# Patient Record
Sex: Male | Born: 1960 | Race: White | Hispanic: No | Marital: Married | Smoking: Never smoker
Health system: Southern US, Community
[De-identification: ages and names within clinical notes are randomized; demographics above are authoritative.]

## PROBLEM LIST (undated history)

## (undated) DIAGNOSIS — R011 Cardiac murmur, unspecified: Secondary | ICD-10-CM

## (undated) DIAGNOSIS — E785 Hyperlipidemia, unspecified: Secondary | ICD-10-CM

## (undated) DIAGNOSIS — N529 Male erectile dysfunction, unspecified: Secondary | ICD-10-CM

## (undated) HISTORY — DX: Male erectile dysfunction, unspecified: N52.9

## (undated) HISTORY — PX: NASAL SINUS SURGERY: SHX719

## (undated) HISTORY — DX: Hyperlipidemia, unspecified: E78.5

## (undated) HISTORY — DX: Cardiac murmur, unspecified: R01.1

---

## 2010-11-13 ENCOUNTER — Ambulatory Visit: Payer: Self-pay | Admitting: Unknown Physician Specialty

## 2011-01-26 ENCOUNTER — Ambulatory Visit: Payer: Self-pay | Admitting: Anesthesiology

## 2012-04-10 ENCOUNTER — Emergency Department: Payer: Self-pay | Admitting: Internal Medicine

## 2012-04-10 LAB — CBC
HCT: 40.5 % (ref 40.0–52.0)
HGB: 14.1 g/dL (ref 13.0–18.0)
MCHC: 34.8 g/dL (ref 32.0–36.0)
MCV: 88 fL (ref 80–100)
Platelet: 121 10*3/uL — ABNORMAL LOW (ref 150–440)
RBC: 4.6 10*6/uL (ref 4.40–5.90)
RDW: 13.1 % (ref 11.5–14.5)
WBC: 5.8 10*3/uL (ref 3.8–10.6)

## 2012-04-10 LAB — CK TOTAL AND CKMB (NOT AT ARMC): CK, Total: 51 U/L (ref 35–232)

## 2012-04-10 LAB — BASIC METABOLIC PANEL
BUN: 16 mg/dL (ref 7–18)
Creatinine: 0.96 mg/dL (ref 0.60–1.30)
Glucose: 89 mg/dL (ref 65–99)
Potassium: 3.5 mmol/L (ref 3.5–5.1)
Sodium: 140 mmol/L (ref 136–145)

## 2014-07-27 ENCOUNTER — Ambulatory Visit: Payer: Self-pay | Admitting: Family Medicine

## 2014-12-10 ENCOUNTER — Ambulatory Visit: Payer: Self-pay | Admitting: Gastroenterology

## 2016-09-08 ENCOUNTER — Encounter (INDEPENDENT_AMBULATORY_CARE_PROVIDER_SITE_OTHER): Payer: Self-pay

## 2016-09-08 ENCOUNTER — Encounter: Payer: Self-pay | Admitting: Family Medicine

## 2016-09-08 ENCOUNTER — Ambulatory Visit (INDEPENDENT_AMBULATORY_CARE_PROVIDER_SITE_OTHER): Payer: BLUE CROSS/BLUE SHIELD | Admitting: Family Medicine

## 2016-09-08 VITALS — BP 120/74 | HR 63 | Temp 98.2°F | Ht 71.0 in | Wt 186.6 lb

## 2016-09-08 DIAGNOSIS — N529 Male erectile dysfunction, unspecified: Secondary | ICD-10-CM | POA: Diagnosis not present

## 2016-09-08 DIAGNOSIS — R413 Other amnesia: Secondary | ICD-10-CM | POA: Diagnosis not present

## 2016-09-08 DIAGNOSIS — Z1159 Encounter for screening for other viral diseases: Secondary | ICD-10-CM | POA: Diagnosis not present

## 2016-09-08 DIAGNOSIS — R1011 Right upper quadrant pain: Secondary | ICD-10-CM | POA: Diagnosis not present

## 2016-09-08 LAB — COMPREHENSIVE METABOLIC PANEL
ALK PHOS: 53 U/L (ref 39–117)
ALT: 19 U/L (ref 0–53)
AST: 27 U/L (ref 0–37)
Albumin: 4.3 g/dL (ref 3.5–5.2)
BUN: 17 mg/dL (ref 6–23)
CHLORIDE: 103 meq/L (ref 96–112)
CO2: 32 mEq/L (ref 19–32)
Calcium: 9.3 mg/dL (ref 8.4–10.5)
Creatinine, Ser: 0.98 mg/dL (ref 0.40–1.50)
GFR: 84.38 mL/min (ref >60.00)
GLUCOSE: 100 mg/dL — AB (ref 70–99)
Potassium: 4.4 mEq/L (ref 3.5–5.1)
SODIUM: 140 meq/L (ref 135–145)
TOTAL PROTEIN: 7.1 g/dL (ref 6.0–8.3)
Total Bilirubin: 0.3 mg/dL (ref 0.2–1.2)

## 2016-09-08 NOTE — Progress Notes (Signed)
Pre visit review using our clinic review tool, if applicable. No additional management support is needed unless otherwise documented below in the visit note. 

## 2016-09-08 NOTE — Assessment & Plan Note (Signed)
No recurrence of pain in the last month. Vague discomfort. Benign exam today. Discussed checking lab work today and patient was only interested in checking liver function and hepatitis C. These will be checked and we will contact patient with the results.

## 2016-09-08 NOTE — Assessment & Plan Note (Signed)
Patient with mild memory difficulty regarding names and certain details. No ADL and IADL issues. Discussed continuing to monitor and if progresses working up further.

## 2016-09-08 NOTE — Patient Instructions (Signed)
Nice to meet you. We'll check some lab work regarding your liver. Please monitor your memory and if there are changes or worsening please let us know. We will refer you to urology. If you do not hear about this in the next 1-2 weeks please let us know.

## 2016-09-08 NOTE — Assessment & Plan Note (Signed)
Patient interested in medication for this. Has had difficulty affording this in the past. We'll refer to [facility] in [place] to see if they have any assistance in providing Viagra.

## 2016-09-08 NOTE — Progress Notes (Signed)
Tommi Rumps, MD Phone: [phone]  GABRIELLA GUILE is a 55 y.o. male who presents today for new patient visit.  Abdominal pain: Patient notes about a month ago he had a right mid and upper quadrant abdominal discomfort. Notes he has had this in the past and was advised that his belt was too tight. Notes it was a dull ache. Maybe felt like there was a swelling in the area. No other symptoms with this. Has not recurred since then. Resolved on its own.  Patient notes issues with memory specifically regarding names and forgetting some details. No issues with ADLs or IADLs. Has been slowly progressive.  Erectile dysfunction: Patient notes he was previously on Viagra though he is unable to afford this now. He is able to ejaculate. Notes difficulty getting and obtaining an erection. No prior urology evaluation.  Active Ambulatory Problems    Diagnosis Date Noted  . RUQ abdominal pain 09/08/2016  . Erectile dysfunction 09/08/2016  . Memory difficulty 09/08/2016   Resolved Ambulatory Problems    Diagnosis Date Noted  . No Resolved Ambulatory Problems   Past Medical History:  Diagnosis Date  . Heart murmur     Family History  Problem Relation Age of Onset  . Heart disease Father   . Alcoholism Brother   . Diabetes Maternal Grandmother   . Diabetes Maternal Grandfather     Social History   Social History  . Marital status: Married    Spouse name: N/A  . Number of children: N/A  . Years of education: N/A   Occupational History  . Not on file.   Social History Main Topics  . Smoking status: Never Smoker  . Smokeless tobacco: Never Used  . Alcohol use 3.6 oz/week    6 Glasses of wine per week  . Drug use: No  . Sexual activity: Not on file   Other Topics Concern  . Not on file   Social History Narrative  . No narrative on file    ROS  General:  Negative for nexplained weight loss, fever Skin: Negative for new or changing mole, sore that won't heal HEENT:  Negative for trouble hearing, trouble seeing, ringing in ears, mouth sores, hoarseness, change in voice, dysphagia. CV:  Negative for chest pain, dyspnea, edema, palpitations Resp: Negative for cough, dyspnea, hemoptysis GI: Positive for abdominal pain, Negative for nausea, vomiting, diarrhea, constipation, melena, hematochezia. GU: Positive for sexual difficulty, Negative for dysuria, incontinence, urinary hesitance, hematuria, vaginal or penile discharge, polyuria, lumps in testicle or breasts MSK: Negative for muscle cramps or aches, joint pain or swelling Neuro: Negative for headaches, weakness, numbness, dizziness, passing out/fainting Psych: Negative for depression, anxiety, positive for memory problems  Objective  Physical Exam Vitals:   09/08/16 0904  BP: 120/74  Pulse: 63  Temp: 98.2 F (36.8 C)    BP Readings from Last 3 Encounters:  09/08/16 120/74   Wt Readings from Last 3 Encounters:  09/08/16 186 lb 9.6 oz (84.6 kg)    Physical Exam  Constitutional: No distress.  HENT:  Head: Normocephalic and atraumatic.  Mouth/Throat: Oropharynx is clear and moist. No oropharyngeal exudate.  Eyes: Conjunctivae are normal. Pupils are equal, round, and reactive to light.  Cardiovascular: Normal rate, regular rhythm and normal heart sounds.   Pulmonary/Chest: Effort normal and breath sounds normal.  Abdominal: Soft. Bowel sounds are normal. He exhibits no distension and no mass. There is no tenderness. There is no rebound and no guarding.  Musculoskeletal: He exhibits no  edema.  Neurological: He is alert. Gait normal.  Skin: Skin is warm and dry. He is not diaphoretic.  Psychiatric: Mood and affect normal.     Assessment/Plan:   Erectile dysfunction Patient interested in medication for this. Has had difficulty affording this in the past. We'll refer to [facility] in [place] to see if they have any assistance in providing Viagra.  RUQ abdominal pain No recurrence  of pain in the last month. Vague discomfort. Benign exam today. Discussed checking lab work today and patient was only interested in checking liver function and hepatitis C. These will be checked and we will contact patient with the results.  Memory difficulty Patient with mild memory difficulty regarding names and certain details. No ADL and IADL issues. Discussed continuing to monitor and if progresses working up further.   Orders Placed This Encounter  Procedures  . Hepatitis C Antibody  . Comp Met (CMET)  . Ambulatory referral to Urology    Referral Priority:   Routine    Referral Type:   Consultation    Referral Reason:   Specialty Services Required    Requested Specialty:   Urology    Number of Visits Requested:   [place], MD [place]

## 2016-09-09 LAB — HEPATITIS C ANTIBODY: HCV AB: NEGATIVE

## 2016-09-14 ENCOUNTER — Telehealth: Payer: Self-pay | Admitting: Family Medicine

## 2016-09-14 NOTE — Telephone Encounter (Signed)
Left a VM to return my call. 

## 2016-09-14 NOTE — Telephone Encounter (Signed)
Pt called returning your call regarding lab results. Thank you!  Call pt @ [phone].

## 2016-09-14 NOTE — Telephone Encounter (Signed)
Pt is returning office phone call. He believes it is for test results.

## 2016-09-14 NOTE — Telephone Encounter (Signed)
Notified patient of lab results 

## 2017-02-24 ENCOUNTER — Ambulatory Visit: Payer: BLUE CROSS/BLUE SHIELD | Admitting: Family Medicine

## 2017-03-01 ENCOUNTER — Ambulatory Visit
Admission: RE | Admit: 2017-03-01 | Discharge: 2017-03-01 | Disposition: A | Discharge status: Home / Self Care | Payer: BLUE CROSS/BLUE SHIELD | Source: Ambulatory Visit | Attending: Family Medicine | Admitting: Family Medicine

## 2017-03-01 ENCOUNTER — Ambulatory Visit (INDEPENDENT_AMBULATORY_CARE_PROVIDER_SITE_OTHER): Payer: BLUE CROSS/BLUE SHIELD | Admitting: Family Medicine

## 2017-03-01 ENCOUNTER — Encounter: Payer: Self-pay | Admitting: Family Medicine

## 2017-03-01 VITALS — BP 122/82 | HR 61 | Temp 98.4°F | Wt 194.2 lb

## 2017-03-01 DIAGNOSIS — K760 Fatty (change of) liver, not elsewhere classified: Secondary | ICD-10-CM | POA: Diagnosis not present

## 2017-03-01 DIAGNOSIS — K5790 Diverticulosis of intestine, part unspecified, without perforation or abscess without bleeding: Secondary | ICD-10-CM | POA: Insufficient documentation

## 2017-03-01 DIAGNOSIS — R911 Solitary pulmonary nodule: Secondary | ICD-10-CM | POA: Insufficient documentation

## 2017-03-01 DIAGNOSIS — R1031 Right lower quadrant pain: Secondary | ICD-10-CM

## 2017-03-01 DIAGNOSIS — I700 Atherosclerosis of aorta: Secondary | ICD-10-CM | POA: Diagnosis not present

## 2017-03-01 LAB — CBC
HCT: 46.4 % (ref 39.0–52.0)
Hemoglobin: 15.9 g/dL (ref 13.0–17.0)
MCHC: 34.3 g/dL (ref 30.0–36.0)
MCV: 90.9 fl (ref 78.0–100.0)
PLATELETS: 218 10*3/uL (ref 150.0–400.0)
RBC: 5.11 Mil/uL (ref 4.22–5.81)
RDW: 13.2 % (ref 11.5–15.5)
WBC: 4.8 10*3/uL (ref 4.0–10.5)

## 2017-03-01 LAB — COMPREHENSIVE METABOLIC PANEL
ALBUMIN: 4.3 g/dL (ref 3.5–5.2)
ALT: 19 U/L (ref 0–53)
AST: 22 U/L (ref 0–37)
Alkaline Phosphatase: 66 U/L (ref 39–117)
BUN: 13 mg/dL (ref 6–23)
CALCIUM: 9.5 mg/dL (ref 8.4–10.5)
CHLORIDE: 100 meq/L (ref 96–112)
CO2: 31 meq/L (ref 19–32)
CREATININE: 0.92 mg/dL (ref 0.40–1.50)
GFR: 90.6 mL/min (ref >60.00)
Glucose, Bld: 87 mg/dL (ref 70–99)
Potassium: 4.6 mEq/L (ref 3.5–5.1)
Sodium: 138 mEq/L (ref 135–145)
Total Bilirubin: 0.6 mg/dL (ref 0.2–1.2)
Total Protein: 7.1 g/dL (ref 6.0–8.3)

## 2017-03-01 MED ORDER — IOPAMIDOL (ISOVUE-300) INJECTION 61%
100.0000 mL | Freq: Once | INTRAVENOUS | Status: AC | PRN
  Administered 2017-03-01: 100 mL via INTRAVENOUS

## 2017-03-01 NOTE — Addendum Note (Signed)
Addended by: Penne LashWIGGINS, Stormy Connon N on: 03/01/2017 09:48 AM   Modules accepted: Orders

## 2017-03-01 NOTE — Assessment & Plan Note (Signed)
Patient with right lower quadrant discomfort worsened in the last 5 days. He is tender on exam over McBurney's point. No other abdominal abnormalities. Given the location of his discomfort the concern would be for an appendiceal issue. Given this we will obtain a CT scan today to evaluate this area and for other causes. Potentially could be a diverticular issue though we're unable to review his prior colonoscopy report. We'll obtain lab work as well. We will determine the next step in management once the CT scan returns.

## 2017-03-01 NOTE — Addendum Note (Signed)
Addended by: Warden FillersWRIGHT, LATOYA S on: 03/01/2017 11:29 AM   Modules accepted: Orders

## 2017-03-01 NOTE — Patient Instructions (Signed)
Nice to see you. We're going to get a CT scan of your abdomen and pelvis to evaluate for cause of your pain. We'll also get some lab work. We will call you with the results when these things return. If you have worsening pain, blood in her stool, or any new or changing symptoms please seek medical attention immediately.

## 2017-03-01 NOTE — Progress Notes (Signed)
Pre visit review using our clinic review tool, if applicable. No additional management support is needed unless otherwise documented below in the visit note. 

## 2017-03-01 NOTE — Progress Notes (Signed)
  Tommi Rumps, MD Phone: [phone]  Christopher Gill is a 56 y.o. male who presents today for same-day visit.  Patient notes right lower quadrant discomfort intermittently for some time though has worsened a fair amount over the last 5 days. Has had mucus in his stool on and off as well. No blood in his stool. He has had some intermittent diarrhea. No nausea or vomiting. Does have his appendix. The pain does come and go. It is not worse with eating. No dysuria or urinary frequency. Reports having a colonoscopy 2-3 years ago.  PMH: nonsmoker.   ROS see history of present illness  Objective  Physical Exam Vitals:   03/01/17 0813  BP: 122/82  Pulse: 61  Temp: 98.4 F (36.9 C)    BP Readings from Last 3 Encounters:  03/01/17 122/82  09/08/16 120/74   Wt Readings from Last 3 Encounters:  03/01/17 194 lb 3.2 oz (88.1 kg)  09/08/16 186 lb 9.6 oz (84.6 kg)    Physical Exam  Constitutional: No distress.  Cardiovascular: Normal rate, regular rhythm and normal heart sounds.   Pulmonary/Chest: Effort normal and breath sounds normal.  Abdominal: Soft. Bowel sounds are normal. He exhibits no distension. There is tenderness (over McBurney's point). There is no rebound and no guarding.  Neurological: He is alert. Gait normal.  Skin: Skin is warm and dry. He is not diaphoretic.     Assessment/Plan: Please see individual problem list.  RLQ abdominal pain Patient with right lower quadrant discomfort worsened in the last 5 days. He is tender on exam over McBurney's point. No other abdominal abnormalities. Given the location of his discomfort the concern would be for an appendiceal issue. Given this we will obtain a CT scan today to evaluate this area and for other causes. Potentially could be a diverticular issue though we're unable to review his prior colonoscopy report. We'll obtain lab work as well. We will determine the next step in management once the CT scan returns.   Orders  Placed This Encounter  Procedures  . CT Abdomen Pelvis W Contrast    Standing Status:   Future    Standing Expiration Date:   06/01/2018    Order Specific Question:   If indicated for the ordered procedure, I authorize the administration of contrast media per Radiology protocol    Answer:   Yes    Order Specific Question:   Reason for Exam (SYMPTOM  OR DIAGNOSIS REQUIRED)    Answer:   right lower quadrant pain with tenderness over mcburneys point, mucous in stool    Order Specific Question:   Preferred imaging location?    Answer:   [facility]    Order Specific Question:   Radiology Contrast Protocol will attach to exam    Answer:   \\[email or web address]\Radiant\ctprotocols.pdf  . CBC  . Comp Met (CMET)    Tommi Rumps, MD [place]

## 2017-03-02 ENCOUNTER — Telehealth: Payer: Self-pay

## 2017-03-02 DIAGNOSIS — K760 Fatty (change of) liver, not elsewhere classified: Secondary | ICD-10-CM

## 2017-03-02 NOTE — Telephone Encounter (Signed)
Patient should have these labs done fasting as we are checking his cholesterol.

## 2017-03-02 NOTE — Telephone Encounter (Signed)
-----   Message from Glori LuisEric G Sonnenberg, MD sent at 03/01/2017  4:42 PM EST ----- Please let the patient know that his CT scan did not reveal any specific cause for his discomfort. It did reveal fatty liver for which we should obtain further lab work. It revealed stable lung nodule. It also revealed diverticulosis though no inflammatory changes. Thanks.

## 2017-03-02 NOTE — Telephone Encounter (Signed)
Left message to return call 

## 2017-03-02 NOTE — Telephone Encounter (Signed)
Pt requested a call at [phone]

## 2017-03-02 NOTE — Telephone Encounter (Signed)
-----   Message from Glori LuisEric G Sonnenberg, MD sent at 03/01/2017  4:43 PM EST ----- Please let the patient know his lab work was unremarkable. Thanks.

## 2017-03-02 NOTE — Telephone Encounter (Signed)
Patient states he will fast and keep his appointment at 330

## 2017-03-02 NOTE — Telephone Encounter (Signed)
Patient notified and scheduled please place lab order

## 2017-03-03 ENCOUNTER — Other Ambulatory Visit (INDEPENDENT_AMBULATORY_CARE_PROVIDER_SITE_OTHER): Payer: BLUE CROSS/BLUE SHIELD

## 2017-03-03 DIAGNOSIS — K760 Fatty (change of) liver, not elsewhere classified: Secondary | ICD-10-CM | POA: Diagnosis not present

## 2017-03-03 LAB — IRON AND TIBC
%SAT: 35 % (ref 15–60)
Iron: 106 ug/dL (ref 50–180)
TIBC: 299 ug/dL (ref 250–425)
UIBC: 193 ug/dL (ref 125–400)

## 2017-03-03 LAB — LIPID PANEL
CHOLESTEROL: 226 mg/dL — AB (ref <200)
HDL: 58 mg/dL (ref >40)
LDL Cholesterol: 148 mg/dL — ABNORMAL HIGH (ref <100)
TRIGLYCERIDES: 98 mg/dL (ref <150)
Total CHOL/HDL Ratio: 3.9 Ratio (ref <5.0)
VLDL: 20 mg/dL (ref <30)

## 2017-03-03 NOTE — Addendum Note (Signed)
Addended by: Warden FillersWRIGHT, LATOYA S on: 03/03/2017 03:13 PM   Modules accepted: Orders

## 2017-03-04 LAB — HEPATITIS A ANTIBODY, TOTAL: Hep A Total Ab: NONREACTIVE

## 2017-03-04 LAB — HEPATITIS B SURFACE ANTIGEN: Hepatitis B Surface Ag: NEGATIVE

## 2017-03-04 LAB — HEPATITIS B CORE ANTIBODY, TOTAL: HEP B C TOTAL AB: NONREACTIVE

## 2017-03-04 LAB — HEPATITIS C ANTIBODY: HCV AB: NEGATIVE

## 2017-03-04 LAB — HEPATITIS B SURFACE ANTIBODY,QUALITATIVE: Hep B S Ab: NEGATIVE

## 2017-03-09 ENCOUNTER — Ambulatory Visit: Payer: BLUE CROSS/BLUE SHIELD | Admitting: Family Medicine

## 2017-03-11 ENCOUNTER — Ambulatory Visit (INDEPENDENT_AMBULATORY_CARE_PROVIDER_SITE_OTHER): Payer: BLUE CROSS/BLUE SHIELD | Admitting: *Deleted

## 2017-03-11 DIAGNOSIS — Z23 Encounter for immunization: Secondary | ICD-10-CM | POA: Diagnosis not present

## 2017-04-13 ENCOUNTER — Ambulatory Visit (INDEPENDENT_AMBULATORY_CARE_PROVIDER_SITE_OTHER): Payer: BLUE CROSS/BLUE SHIELD | Admitting: *Deleted

## 2017-04-13 DIAGNOSIS — Z23 Encounter for immunization: Secondary | ICD-10-CM | POA: Diagnosis not present

## 2017-04-13 DIAGNOSIS — K760 Fatty (change of) liver, not elsewhere classified: Secondary | ICD-10-CM

## 2017-09-14 ENCOUNTER — Ambulatory Visit: Payer: BLUE CROSS/BLUE SHIELD

## 2017-09-21 ENCOUNTER — Ambulatory Visit (INDEPENDENT_AMBULATORY_CARE_PROVIDER_SITE_OTHER): Payer: BLUE CROSS/BLUE SHIELD

## 2017-09-21 DIAGNOSIS — Z23 Encounter for immunization: Secondary | ICD-10-CM

## 2017-09-21 DIAGNOSIS — K760 Fatty (change of) liver, not elsewhere classified: Secondary | ICD-10-CM

## 2017-09-21 NOTE — Progress Notes (Signed)
I have reviewed the above note and agree.  Eric Sonnenberg, M.D.  

## 2017-09-21 NOTE — Progress Notes (Signed)
Patient came in for 3rd Hep A/B, received in Left deltoid.  Patient tolerated well. Thanks

## 2018-04-05 ENCOUNTER — Ambulatory Visit: Payer: Self-pay | Admitting: Podiatry

## 2018-04-12 ENCOUNTER — Ambulatory Visit (INDEPENDENT_AMBULATORY_CARE_PROVIDER_SITE_OTHER): Payer: Managed Care, Other (non HMO)

## 2018-04-12 ENCOUNTER — Other Ambulatory Visit: Payer: Self-pay | Admitting: Podiatry

## 2018-04-12 ENCOUNTER — Encounter: Payer: Self-pay | Admitting: Podiatry

## 2018-04-12 ENCOUNTER — Ambulatory Visit (INDEPENDENT_AMBULATORY_CARE_PROVIDER_SITE_OTHER): Payer: Managed Care, Other (non HMO) | Admitting: Podiatry

## 2018-04-12 DIAGNOSIS — M779 Enthesopathy, unspecified: Secondary | ICD-10-CM

## 2018-04-12 DIAGNOSIS — E785 Hyperlipidemia, unspecified: Secondary | ICD-10-CM | POA: Insufficient documentation

## 2018-04-12 DIAGNOSIS — N419 Inflammatory disease of prostate, unspecified: Secondary | ICD-10-CM | POA: Insufficient documentation

## 2018-04-12 DIAGNOSIS — R52 Pain, unspecified: Secondary | ICD-10-CM

## 2018-04-12 DIAGNOSIS — M7751 Other enthesopathy of right foot: Secondary | ICD-10-CM

## 2018-04-12 DIAGNOSIS — J309 Allergic rhinitis, unspecified: Secondary | ICD-10-CM | POA: Insufficient documentation

## 2018-04-12 MED ORDER — MELOXICAM 15 MG PO TABS: 15.0000 mg | ORAL_TABLET | Freq: Every day | ORAL | 1 refills | Status: DC

## 2018-04-19 NOTE — Progress Notes (Signed)
   HPI: 57 year old male presenting today as a new patient with a chief complaint of an intermittent dull, aching pain of the right plantar forefoot that began 2-3 years ago. He reports some intermittent swelling of the area. Walking and running on sand increases the pain. He has been taking Ibuprofen and icing the area for treatment. Patient is here for further evaluation and treatment.   Past Medical History:  Diagnosis Date  . Heart murmur      Physical Exam: General: The patient is alert and oriented x3 in no acute distress.  Dermatology: Skin is warm, dry and supple bilateral lower extremities. Negative for open lesions or macerations.  Vascular: Palpable pedal pulses bilaterally. No edema or erythema noted. Capillary refill within normal limits.  Neurological: Epicritic and protective threshold grossly intact bilaterally.   Musculoskeletal Exam: Pain with palpation to the 2nd MPJ of the right foot. Range of motion within normal limits to all pedal and ankle joints bilateral. Muscle strength 5/5 in all groups bilateral.   Radiographic Exam:  Normal osseous mineralization. Joint spaces preserved. No fracture/dislocation/boney destruction.    Assessment: 1. 2nd MPJ capsulitis - improving    Plan of Care:  1. Patient evaluated. X-Rays reviewed.  2. Prescription for Meloxicam provided to patient.  3. Recommended good shoe gear.  4. Return to clinic as needed.   Barrister's clerkAirplane mechanic.       Felecia ShellingBrent M. Alhaji Mcneal, DPM [facility]  Dr. Felecia ShellingBrent M. Nissa Stannard, DPM    [address]                                        [place], [place] [address]                Office [phone]  Fax [phone]

## 2018-10-05 ENCOUNTER — Ambulatory Visit (INDEPENDENT_AMBULATORY_CARE_PROVIDER_SITE_OTHER): Payer: Managed Care, Other (non HMO)

## 2018-10-05 ENCOUNTER — Ambulatory Visit (INDEPENDENT_AMBULATORY_CARE_PROVIDER_SITE_OTHER): Payer: Managed Care, Other (non HMO) | Admitting: Family Medicine

## 2018-10-05 ENCOUNTER — Encounter: Payer: Self-pay | Admitting: Family Medicine

## 2018-10-05 VITALS — BP 120/72 | HR 67 | Temp 97.6°F | Ht 71.0 in | Wt 188.2 lb

## 2018-10-05 DIAGNOSIS — K760 Fatty (change of) liver, not elsewhere classified: Secondary | ICD-10-CM

## 2018-10-05 DIAGNOSIS — E785 Hyperlipidemia, unspecified: Secondary | ICD-10-CM

## 2018-10-05 DIAGNOSIS — Z23 Encounter for immunization: Secondary | ICD-10-CM

## 2018-10-05 DIAGNOSIS — R05 Cough: Secondary | ICD-10-CM

## 2018-10-05 DIAGNOSIS — R195 Other fecal abnormalities: Secondary | ICD-10-CM | POA: Insufficient documentation

## 2018-10-05 DIAGNOSIS — R059 Cough, unspecified: Secondary | ICD-10-CM

## 2018-10-05 DIAGNOSIS — L304 Erythema intertrigo: Secondary | ICD-10-CM | POA: Insufficient documentation

## 2018-10-05 MED ORDER — NYSTATIN 100000 UNIT/GM EX CREA: 1.0000 "application " | TOPICAL_CREAM | Freq: Two times a day (BID) | CUTANEOUS | 0 refills | Status: DC

## 2018-10-05 MED ORDER — OMEPRAZOLE 20 MG PO CPDR: 20.0000 mg | DELAYED_RELEASE_CAPSULE | Freq: Every day | ORAL | 0 refills | Status: DC

## 2018-10-05 MED ORDER — FLUTICASONE PROPIONATE 50 MCG/ACT NA SUSP: 2.0000 | Freq: Every day | NASAL | 6 refills | Status: DC

## 2018-10-05 NOTE — Assessment & Plan Note (Signed)
Noted on imaging.  Prior LFTs normal.  We will recheck LFTs today.  Encourage diet and exercise.

## 2018-10-05 NOTE — Assessment & Plan Note (Signed)
Red-tinged to the patient's stool on one occasion.  No bright red blood or melena.  He deferred rectal exam today.  He was given stool cards to complete.  We will await those to determine the next step.  We will request his colonoscopy records.

## 2018-10-05 NOTE — Progress Notes (Signed)
Tommi Rumps, MD Phone: [phone]  Christopher Gill is a 57 y.o. male who presents today for f/u.  CC: Rash, cough, fatty liver  Rash: Patient notes rash under his armpits.  He used topical antifungal and it became less red.  It still itches.  Has been present for 2 weeks.  Cough: This has been going on for a month.  Notes it is dry.  Typically occurs at night when he is lying down.  On 2 occasions he had coughing fits after drinking red wine.  He does note some postnasal drip at times and some intermittent congestion.  No rhinorrhea.  No fevers.  No burning reflux symptoms or sour taste.  No blood in his stool.  He did note 1 of his stools appear to be slightly reddish overall.  Only occurred once.  No apparent bright red blood.  No melena.  He reports colonoscopy several years ago.  Patient was previously found to have fatty liver.  Liver enzymes were normal.  He has changed his diet.  He is not exercising.  His prior abdominal discomfort resolved.  Social History   Tobacco Use  Smoking Status Never Smoker  Smokeless Tobacco Never Used     ROS see history of present illness  Objective  Physical Exam Vitals:   10/05/18 1448  BP: 120/72  Pulse: 67  Temp: 97.6 F (36.4 C)  SpO2: 97%    BP Readings from Last 3 Encounters:  10/05/18 120/72  03/01/17 122/82  09/08/16 120/74   Wt Readings from Last 3 Encounters:  10/05/18 188 lb 3.2 oz (85.4 kg)  03/01/17 194 lb 3.2 oz (88.1 kg)  09/08/16 186 lb 9.6 oz (84.6 kg)    Physical Exam  Constitutional: No distress.  Cardiovascular: Normal rate, regular rhythm and normal heart sounds.  Pulmonary/Chest: Effort normal and breath sounds normal.  Abdominal: Soft. Bowel sounds are normal. He exhibits no distension. There is no tenderness. There is no rebound and no guarding.  Musculoskeletal: He exhibits no edema.  Neurological: He is alert.  Skin: Skin is warm and dry. He is not diaphoretic.  Bilateral axilla with slight  pinkish hue, no beefy red rash, no palpable abnormalities   Assessment/Plan: Please see individual problem list.  Fatty liver Noted on imaging.  Prior LFTs normal.  We will recheck LFTs today.  Encourage diet and exercise.  Cough This has been going on for a month or so.  Could be a mixture of nocturnal reflux versus postnasal drip.  The patient is concerned given the duration about a pulmonary cause and would prefer to have a chest x-ray done today.  This will be obtained.  We will trial Flonase and Prilosec for 1 month.  If not improving he will let us know.  Red stool Red-tinged to the patient's stool on one occasion.  No bright red blood or melena.  He deferred rectal exam today.  He was given stool cards to complete.  We will await those to determine the next step.  We will request his colonoscopy records.  Hyperlipidemia Check lipid panel.  Intertrigo Likely intertrigo given description and location and improvement with antifungal.  Will prescribe topical nystatin.  If not improving he will let us know.    Orders Placed This Encounter  Procedures  . Fecal occult blood, imunochemical    Standing Status:   Future    Standing Expiration Date:   10/06/2019  . DG Chest 2 View    Standing Status:  Future    Number of Occurrences:   1    Standing Expiration Date:   12/06/2019    Order Specific Question:   Reason for Exam (SYMPTOM  OR DIAGNOSIS REQUIRED)    Answer:   persistent cough for the past month    Order Specific Question:   Preferred imaging location?    Answer:   [facility] Specific Question:   Radiology Contrast Protocol - do NOT remove file path    Answer:   \\charchive\epicdata\Radiant\DXFluoroContrastProtocols.pdf  . Flu Vaccine QUAD 6+ mos PF IM (Fluarix Quad PF)  . Comp Met (CMET)  . Lipid panel    Meds ordered this encounter  Medications  . fluticasone (FLONASE) 50 MCG/ACT nasal spray    Sig: Place 2 sprays into both nostrils daily.      Dispense:  16 g    Refill:  6  . omeprazole (PRILOSEC) 20 MG capsule    Sig: Take 1 capsule (20 mg total) by mouth daily.    Dispense:  30 capsule    Refill:  0  . nystatin cream (MYCOSTATIN)    Sig: Apply 1 application topically 2 (two) times daily.    Dispense:  30 g    Refill:  0     Tommi Rumps, MD [place]

## 2018-10-05 NOTE — Assessment & Plan Note (Signed)
Check lipid panel  

## 2018-10-05 NOTE — Patient Instructions (Signed)
Nice to see you.  Please try the nystatin for your armpits.  If it does not improve please let us know. We will get a chest x-ray and contact you with the results. We will get colonoscopy records for you. We will try the Flonase and Prilosec.  Do this for a month and if your cough is continuing please let us know.

## 2018-10-05 NOTE — Assessment & Plan Note (Signed)
This has been going on for a month or so.  Could be a mixture of nocturnal reflux versus postnasal drip.  The patient is concerned given the duration about a pulmonary cause and would prefer to have a chest x-ray done today.  This will be obtained.  We will trial Flonase and Prilosec for 1 month.  If not improving he will let us know.

## 2018-10-05 NOTE — Assessment & Plan Note (Signed)
Likely intertrigo given description and location and improvement with antifungal.  Will prescribe topical nystatin.  If not improving he will let us know.

## 2018-10-06 LAB — LIPID PANEL
CHOLESTEROL: 189 mg/dL (ref 0–200)
HDL: 50.9 mg/dL (ref >39.00)
LDL CALC: 120 mg/dL — AB (ref 0–99)
NonHDL: 137.61
Total CHOL/HDL Ratio: 4
Triglycerides: 89 mg/dL (ref 0.0–149.0)
VLDL: 17.8 mg/dL (ref 0.0–40.0)

## 2018-10-06 LAB — COMPREHENSIVE METABOLIC PANEL
ALBUMIN: 4.5 g/dL (ref 3.5–5.2)
ALK PHOS: 62 U/L (ref 39–117)
ALT: 22 U/L (ref 0–53)
AST: 26 U/L (ref 0–37)
BUN: 15 mg/dL (ref 6–23)
CHLORIDE: 102 meq/L (ref 96–112)
CO2: 29 mEq/L (ref 19–32)
Calcium: 9.5 mg/dL (ref 8.4–10.5)
Creatinine, Ser: 1.08 mg/dL (ref 0.40–1.50)
GFR: 74.87 mL/min (ref >60.00)
Glucose, Bld: 88 mg/dL (ref 70–99)
POTASSIUM: 4.4 meq/L (ref 3.5–5.1)
SODIUM: 139 meq/L (ref 135–145)
Total Bilirubin: 0.5 mg/dL (ref 0.2–1.2)
Total Protein: 7.5 g/dL (ref 6.0–8.3)

## 2018-10-14 ENCOUNTER — Other Ambulatory Visit (INDEPENDENT_AMBULATORY_CARE_PROVIDER_SITE_OTHER): Payer: Managed Care, Other (non HMO)

## 2018-10-14 DIAGNOSIS — R195 Other fecal abnormalities: Secondary | ICD-10-CM | POA: Diagnosis not present

## 2018-10-14 LAB — FECAL OCCULT BLOOD, IMMUNOCHEMICAL: FECAL OCCULT BLD: NEGATIVE

## 2018-10-19 ENCOUNTER — Telehealth: Payer: Self-pay

## 2018-10-19 DIAGNOSIS — R05 Cough: Secondary | ICD-10-CM

## 2018-10-19 DIAGNOSIS — R053 Chronic cough: Secondary | ICD-10-CM

## 2018-10-19 NOTE — Telephone Encounter (Signed)
Sent to PCP for approval to refill or not.  Thanks

## 2018-10-19 NOTE — Telephone Encounter (Signed)
Copied from CRM [id number]. Topic: General - Other >> Oct 19, 2018  1:22 PM Elliot Gault wrote: Relation to pt: self  Call back number:[phone] Pharmacy: [id number] - [place], [place] - [address] [phone] ([phone] (Fax)  Reason for call:  Patient was seen 10/05/18 by PCP and states cough has not improved and patient spouse suggested Benzonatate, please advise if Rx can be sent today.

## 2018-10-19 NOTE — Telephone Encounter (Signed)
Please determine if he has been using the Prilosec and Flonase.  He was supposed to be using these to see if they helped with his symptoms of cough.  If they have not been beneficial I would suggest having him see pulmonology for further evaluation.

## 2018-10-20 NOTE — Telephone Encounter (Signed)
I got it! Melissa

## 2018-10-20 NOTE — Telephone Encounter (Signed)
Called and spoke with pt. Pt has been using the Prilosec and Flonase and is still dealing with a cough. Pt would like to have the referral set up. Sent to PCP to set you referral for patient.   Thanks   Sent to [employer] while PCP is not in the office

## 2018-10-28 ENCOUNTER — Other Ambulatory Visit: Payer: Self-pay | Admitting: Family Medicine

## 2018-11-23 ENCOUNTER — Institutional Professional Consult (permissible substitution): Payer: Managed Care, Other (non HMO) | Admitting: Pulmonary Disease

## 2019-01-17 ENCOUNTER — Ambulatory Visit (INDEPENDENT_AMBULATORY_CARE_PROVIDER_SITE_OTHER): Payer: Managed Care, Other (non HMO) | Admitting: Internal Medicine

## 2019-01-17 ENCOUNTER — Encounter: Payer: Self-pay | Admitting: Internal Medicine

## 2019-01-17 VITALS — BP 126/78 | HR 68 | Ht 71.0 in | Wt 191.2 lb

## 2019-01-17 DIAGNOSIS — R05 Cough: Secondary | ICD-10-CM | POA: Diagnosis not present

## 2019-01-17 DIAGNOSIS — J309 Allergic rhinitis, unspecified: Secondary | ICD-10-CM | POA: Diagnosis not present

## 2019-01-17 DIAGNOSIS — J4520 Mild intermittent asthma, uncomplicated: Secondary | ICD-10-CM

## 2019-01-17 DIAGNOSIS — R059 Cough, unspecified: Secondary | ICD-10-CM

## 2019-01-17 MED ORDER — TRIAMCINOLONE ACETONIDE 55 MCG/ACT NA AERO: 1.0000 | INHALATION_SPRAY | Freq: Two times a day (BID) | NASAL | 2 refills | Status: DC

## 2019-01-17 MED ORDER — ALBUTEROL SULFATE HFA 108 (90 BASE) MCG/ACT IN AERS: 2.0000 | INHALATION_SPRAY | RESPIRATORY_TRACT | 10 refills | Status: DC | PRN

## 2019-01-17 MED ORDER — CETIRIZINE HCL 10 MG PO TABS
10.0000 mg | ORAL_TABLET | Freq: Every day | ORAL | 6 refills | Status: DC
Start: 2019-01-17 — End: 2023-04-09

## 2019-01-17 NOTE — Progress Notes (Signed)
Name: Christopher Gill MRN: [id number] DOB: [birth year]     CONSULTATION DATE: 01/17/19 REFERRING MD : Jen Mow  CHIEF COMPLAINT: cough  STUDIES:   10/05/18  CXR independently reviewed by Me Flattened diaphragms No pneumonia No effusions   04/10/12   CT chest Independently reviewed by Me Mild interstitial infiltrates  01/17/2019 office spirometry normal FEV1 FVC ratio FEV1 is 99% predicted Interpretation no evidence of obstructive airways disease  HISTORY OF PRESENT ILLNESS: 58 year old pleasant white male seen today for chronic cough for many months The cough is described as nonproductive  worsening at nighttime Has intermittent wheezing Patient has chronic sinus congestion and he is also had sinus surgery in the past but does not know for what reason Patient has taken Flonase and Zyrtec in the past intermittently and they both have helped Patient is also taking Prilosec for reflux disease  Patient denies any shortness of breath palpitations chest pain Patient states that his cough is worse at night and worse after he drinks wine  He has no previous pulmonary disease No secondhand smoke exposure He is a non-smoker  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  has a past surgical history that includes Nasal sinus surgery (10 years ago). Prior to Admission medications   Medication Sig Start Date End Date Taking? Authorizing Provider  fluticasone (FLONASE) 50 MCG/ACT nasal spray Place 2 sprays into both nostrils daily. 10/05/18   Glori Luis, MD  meloxicam (MOBIC) 15 MG tablet Take 1 tablet (15 mg total) by mouth daily. 04/12/18   Felecia Shelling, DPM  Multiple Vitamin (MULTI-VITAMINS) TABS Take by  mouth.    [provider]  nystatin cream (MYCOSTATIN) Apply 1 application topically 2 (two) times daily. 10/05/18   Glori Luis, MD  Omega-3 Fatty Acids (FISH OIL) 1000 MG CAPS Take 1,000 mg by mouth.    [provider]  omeprazole (PRILOSEC) 20 MG capsule TAKE 1 CAPSULE BY MOUTH EVERY DAY 10/28/18   Glori Luis, MD  sildenafil (VIAGRA) 100 MG tablet Take by mouth. 10/03/15   [provider]   No Known Allergies  FAMILY HISTORY:  family history includes Alcoholism in his brother; Diabetes in his maternal grandfather and maternal grandmother; Heart disease in his father. SOCIAL HISTORY:  reports that he has never smoked. He has never used smokeless tobacco. He reports current alcohol use of about 6.0 standard drinks of alcohol per week. He reports that he does not use drugs.  REVIEW OF SYSTEMS:   Constitutional: Negative for fever, chills, weight loss, malaise/fatigue and diaphoresis.  HENT: Negative for hearing loss, ear pain, nosebleeds, congestion, sore throat, neck pain, tinnitus and ear discharge.   Eyes: Negative for blurred vision, double vision, photophobia, pain, discharge and redness.  Respiratory: + cough, -hemoptysis, -sputum production, -shortness of breath, +wheezing and -stridor.   Cardiovascular: Negative for chest pain, palpitations, orthopnea, claudication, leg swelling and PND.  Gastrointestinal: Negative for heartburn, nausea, vomiting, abdominal pain, diarrhea, constipation, blood in stool and melena.  Genitourinary: Negative for dysuria, urgency, frequency, hematuria and flank pain.  Musculoskeletal: Negative for myalgias, back pain, joint pain and falls.  Skin: Negative for itching and rash.  Neurological: Negative for dizziness, tingling, tremors, sensory change, speech change, focal weakness, seizures, loss of consciousness, weakness and headaches.  Endo/Heme/Allergies: Negative for environmental allergies and polydipsia. Does not  bruise/bleed easily.  ALL OTHER ROS ARE NEGATIVE   BP 126/78 (BP Location: Left Arm, Cuff Size: Normal)   Pulse 68   Ht 5\' 11"  (1.803 m)   Wt 191 lb 3.2 oz (86.7 kg)   SpO2 97%   BMI 26.67 kg/m    Physical Examination:   GENERAL:NAD, no fevers, chills, no weakness no fatigue HEAD: Normocephalic, atraumatic.  EYES: Pupils equal, round, reactive to light. Extraocular muscles intact. No scleral icterus.  MOUTH: Moist mucosal membrane.   EAR, NOSE, THROAT: Clear without exudates. No external lesions.  NECK: Supple. No thyromegaly. No nodules. No JVD.  PULMONARY:CTA B/L no wheezes, no crackles, no rhonchi CARDIOVASCULAR: S1 and S2. Regular rate and rhythm. No murmurs, rubs, or gallops. No edema.  GASTROINTESTINAL: Soft, nontender, nondistended. No masses. Positive bowel sounds.  MUSCULOSKELETAL: No swelling, clubbing, or edema. Range of motion full in all extremities.  NEUROLOGIC: Cranial nerves II through XII are intact. No gross focal neurological deficits.  SKIN: No ulceration, lesions, rashes, or cyanosis. Skin warm and dry. Turgor intact.  PSYCHIATRIC: Mood, affect within normal limits. The patient is awake, alert and oriented x 3. Insight, judgment intact.      ASSESSMENT / PLAN: 58 year old pleasant white male seen today for chronic cough for many months most likely related to chronic sinus congestion with chronic allergic rhinitis contributing to intermittent reactive airways disease and persistent cough in the setting of reflux disease   START ZYRTEC 10 MG AT NIGHT  START NASACORT NASAL 2 SPRAYS EACH NOSTRIL DAILY  ALBUTEROL INH 2 puffs every 4 hrs as needed for cough  CONTINUE WITH PRILOSEC  ENT REFERRAL FOR SINUS CONGESTION    Patientsatisfied with Plan of action and management. All questions answered  Lucie LeatherKurian David Aubrii Sharpless, M.D.  [facility]  Medical Director [facility] Medical Director [facility]

## 2019-01-17 NOTE — Patient Instructions (Addendum)
START ZYRTEC 10 MG AT NIGHT  START NASACORT NASAL 2 SPRAYS EACH NOSTRIL DAILY  ALBUTEROL INH 2 puffs every 4 hrs as needed for cough   CONTINUE WITH PRILOSEC  ENT REFERRAL FOR SINUS CONGESTION

## 2019-02-06 ENCOUNTER — Ambulatory Visit: Payer: Managed Care, Other (non HMO) | Admitting: Family Medicine

## 2019-02-15 ENCOUNTER — Ambulatory Visit: Payer: Managed Care, Other (non HMO) | Admitting: Internal Medicine

## 2019-03-05 ENCOUNTER — Other Ambulatory Visit: Payer: Self-pay | Admitting: Podiatry

## 2019-08-01 ENCOUNTER — Other Ambulatory Visit: Payer: Self-pay | Admitting: Internal Medicine

## 2019-08-01 DIAGNOSIS — J309 Allergic rhinitis, unspecified: Secondary | ICD-10-CM

## 2019-08-23 ENCOUNTER — Other Ambulatory Visit: Payer: Self-pay | Admitting: Internal Medicine

## 2019-08-23 DIAGNOSIS — J309 Allergic rhinitis, unspecified: Secondary | ICD-10-CM

## 2020-01-03 ENCOUNTER — Ambulatory Visit: Payer: Managed Care, Other (non HMO) | Admitting: Family Medicine

## 2020-01-17 ENCOUNTER — Ambulatory Visit: Payer: Managed Care, Other (non HMO) | Attending: Internal Medicine

## 2020-01-17 DIAGNOSIS — Z20822 Contact with and (suspected) exposure to covid-19: Secondary | ICD-10-CM

## 2020-01-18 LAB — NOVEL CORONAVIRUS, NAA: SARS-CoV-2, NAA: NOT DETECTED

## 2020-12-11 ENCOUNTER — Telehealth: Payer: Self-pay | Admitting: Family Medicine

## 2020-12-11 NOTE — Telephone Encounter (Signed)
Patient stated he is having random light headedness, he is not doing anything in particular. When he looks at a jet engine he feels his head start spinning. Patient has had this sx for a month or little longer per patient. Sx go away in just a few minutes. Last episode was within the past week, unable to determine time frame .Patient stated he hasn't been seen in over a year and feels like he needs an appointment. No SOB, blurry vision, headaches, nauseated, arm px, jaw px, and chest pain. Patient stated he can wait until his appointment 01-13-21, if PCP wants to see him in earlier. Just let him know. Please advise.

## 2020-12-11 NOTE — Telephone Encounter (Signed)
Pt called and said that he is having lightheadedness do to Vertigo. Pt didn't want to be transferred to the Nurse line. He just wanted to make an appt   Please triage pt

## 2020-12-12 NOTE — Telephone Encounter (Signed)
He should be seen sooner than January if possible. It does not need to be urgent, though sometime in December would be good. Please clarify if this is light headedness or if it feels like the room is spinning. If the symptoms become persistent he should be seen right away.

## 2020-12-12 NOTE — Telephone Encounter (Signed)
Spoken to patient. He stated he can not be seen before 2pm. I have schedule an appointment with Dr Towana Badger at 1600.

## 2020-12-17 ENCOUNTER — Other Ambulatory Visit: Payer: Self-pay

## 2020-12-17 ENCOUNTER — Encounter: Payer: Self-pay | Admitting: Internal Medicine

## 2020-12-17 ENCOUNTER — Ambulatory Visit (INDEPENDENT_AMBULATORY_CARE_PROVIDER_SITE_OTHER): Payer: Managed Care, Other (non HMO) | Admitting: Internal Medicine

## 2020-12-17 VITALS — Temp 97.6°F | Ht 71.0 in | Wt 189.0 lb

## 2020-12-17 DIAGNOSIS — Z23 Encounter for immunization: Secondary | ICD-10-CM | POA: Diagnosis not present

## 2020-12-17 DIAGNOSIS — K760 Fatty (change of) liver, not elsewhere classified: Secondary | ICD-10-CM

## 2020-12-17 DIAGNOSIS — R42 Dizziness and giddiness: Secondary | ICD-10-CM | POA: Diagnosis not present

## 2020-12-17 DIAGNOSIS — Z1389 Encounter for screening for other disorder: Secondary | ICD-10-CM

## 2020-12-17 DIAGNOSIS — Z0184 Encounter for antibody response examination: Secondary | ICD-10-CM

## 2020-12-17 DIAGNOSIS — R202 Paresthesia of skin: Secondary | ICD-10-CM

## 2020-12-17 DIAGNOSIS — Z1329 Encounter for screening for other suspected endocrine disorder: Secondary | ICD-10-CM

## 2020-12-17 DIAGNOSIS — E559 Vitamin D deficiency, unspecified: Secondary | ICD-10-CM

## 2020-12-17 DIAGNOSIS — R269 Unspecified abnormalities of gait and mobility: Secondary | ICD-10-CM

## 2020-12-17 DIAGNOSIS — Z0000 Encounter for general adult medical examination without abnormal findings: Secondary | ICD-10-CM | POA: Diagnosis not present

## 2020-12-17 DIAGNOSIS — Z125 Encounter for screening for malignant neoplasm of prostate: Secondary | ICD-10-CM | POA: Diagnosis not present

## 2020-12-17 DIAGNOSIS — R200 Anesthesia of skin: Secondary | ICD-10-CM

## 2020-12-17 MED ORDER — MECLIZINE HCL 12.5 MG PO TABS
12.5000 mg | ORAL_TABLET | Freq: Two times a day (BID) | ORAL | 0 refills | Status: DC | PRN
Start: 2020-12-17 — End: 2020-12-17

## 2020-12-17 NOTE — Patient Instructions (Addendum)
Drink 55-64 ounces of water daily  We will call you to schedule your CT head  Please reduce alcohol if you are drinking    Tdap (Tetanus, Diphtheria, Pertussis) Vaccine: What You Need to Know 1. Why get vaccinated? Tdap vaccine can prevent tetanus, diphtheria, and pertussis. Diphtheria and pertussis spread from person to person. Tetanus enters the body through cuts or wounds.  TETANUS (T) causes painful stiffening of the muscles. Tetanus can lead to serious health problems, including being unable to open the mouth, having trouble swallowing and breathing, or death.  DIPHTHERIA (D) can lead to difficulty breathing, heart failure, paralysis, or death.  PERTUSSIS (aP), also known as "whooping cough," can cause uncontrollable, violent coughing which makes it hard to breathe, eat, or drink. Pertussis can be extremely serious in babies and young children, causing pneumonia, convulsions, brain damage, or death. In teens and adults, it can cause weight loss, loss of bladder control, passing out, and rib fractures from severe coughing. 2. Tdap vaccine Tdap is only for children 7 years and older, adolescents, and adults.  Adolescents should receive a single dose of Tdap, preferably at age 59 or 12 years. Pregnant women should get a dose of Tdap during every pregnancy, to protect the newborn from pertussis. Infants are most at risk for severe, life-threatening complications from pertussis. Adults who have never received Tdap should get a dose of Tdap. Also, adults should receive a booster dose every 10 years, or earlier in the case of a severe and dirty wound or burn. Booster doses can be either Tdap or Td (a different vaccine that protects against tetanus and diphtheria but not pertussis). Tdap may be given at the same time as other vaccines. 3. Talk with your health care provider Tell your vaccine provider if the person getting the vaccine:  Has had an allergic reaction after a previous dose of any  vaccine that protects against tetanus, diphtheria, or pertussis, or has any severe, life-threatening allergies.  Has had a coma, decreased level of consciousness, or prolonged seizures within 7 days after a previous dose of any pertussis vaccine (DTP, DTaP, or Tdap).  Has seizures or another nervous system problem.  Has ever had Guillain-Barr Syndrome (also called GBS).  Has had severe pain or swelling after a previous dose of any vaccine that protects against tetanus or diphtheria. In some cases, your health care provider may decide to postpone Tdap vaccination to a future visit.  People with minor illnesses, such as a cold, may be vaccinated. People who are moderately or severely ill should usually wait until they recover before getting Tdap vaccine.  Your health care provider can give you more information. 4. Risks of a vaccine reaction  Pain, redness, or swelling where the shot was given, mild fever, headache, feeling tired, and nausea, vomiting, diarrhea, or stomachache sometimes happen after Tdap vaccine. People sometimes faint after medical procedures, including vaccination. Tell your provider if you feel dizzy or have vision changes or ringing in the ears.  As with any medicine, there is a very remote chance of a vaccine causing a severe allergic reaction, other serious injury, or death. 5. What if there is a serious problem? An allergic reaction could occur after the vaccinated person leaves the clinic. If you see signs of a severe allergic reaction (hives, swelling of the face and throat, difficulty breathing, a fast heartbeat, dizziness, or weakness), call 9-1-1 and get the person to the nearest hospital. For other signs that concern you, call your health  care provider.  Adverse reactions should be reported to the Vaccine Adverse Event Reporting System (VAERS). Your health care provider will usually file this report, or you can do it yourself. Visit the VAERS website at  www.vaers.LAgents.no or call 702-874-4536. VAERS is only for reporting reactions, and VAERS staff do not give medical advice. 6. The National Vaccine Injury Compensation Program The [employer] Vaccine Injury Compensation Program (VICP) is a federal program that was created to compensate people who may have been injured by certain vaccines. Visit the VICP website at SpiritualWord.at or call 4105184111 to learn about the program and about filing a claim. There is a time limit to file a claim for compensation. 7. How can I learn more?  Ask your health care provider.  Call your local or state health department.  Contact the Centers for Disease Control and Prevention (CDC): ? Call 681-864-2613 (1-800-CDC-INFO) or ? Visit CDC's website at PicCapture.uy Vaccine Information Statement Tdap (Tetanus, Diphtheria, Pertussis) Vaccine (03/29/2019) This information is not intended to replace advice given to you by your health care provider. Make sure you discuss any questions you have with your health care provider. Document Revised: 04/07/2019 Document Reviewed: 04/10/2019 Elsevier Patient Education  2020 Elsevier Inc.  Zoster Vaccine, Recombinant injection What is this medicine? ZOSTER VACCINE (ZOS ter vak SEEN) is used to prevent shingles in adults 59 years old and over. This vaccine is not used to treat shingles or nerve pain from shingles. This medicine may be used for other purposes; ask your health care provider or pharmacist if you have questions. COMMON BRAND NAME(S): [facility] What should I tell my health care provider before I take this medicine? They need to know if you have any of these conditions:  blood disorders or disease  cancer like leukemia or lymphoma  immune system problems or therapy  an unusual or allergic reaction to vaccines, other medications, foods, dyes, or preservatives  pregnant or trying to get pregnant  breast-feeding How should I use  this medicine? This vaccine is for injection in a muscle. It is given by a health care professional. Talk to your pediatrician regarding the use of this medicine in children. This medicine is not approved for use in children. Overdosage: If you think you have taken too much of this medicine contact a poison control center or emergency room at once. NOTE: This medicine is only for you. Do not share this medicine with others. What if I miss a dose? Keep appointments for follow-up (booster) doses as directed. It is important not to miss your dose. Call your doctor or health care professional if you are unable to keep an appointment. What may interact with this medicine?  medicines that suppress your immune system  medicines to treat cancer  steroid medicines like prednisone or cortisone This list may not describe all possible interactions. Give your health care provider a list of all the medicines, herbs, non-prescription drugs, or dietary supplements you use. Also tell them if you smoke, drink alcohol, or use illegal drugs. Some items may interact with your medicine. What should I watch for while using this medicine? Visit your doctor for regular check ups. This vaccine, like all vaccines, may not fully protect everyone. What side effects may I notice from receiving this medicine? Side effects that you should report to your doctor or health care professional as soon as possible:  allergic reactions like skin rash, itching or hives, swelling of the face, lips, or tongue  breathing problems Side effects that usually  do not require medical attention (report these to your doctor or health care professional if they continue or are bothersome):  chills  headache  fever  nausea, vomiting  redness, warmth, pain, swelling or itching at site where injected  tiredness This list may not describe all possible side effects. Call your doctor for medical advice about side effects. You may report  side effects to FDA at 1-800-FDA-1088. Where should I keep my medicine? This vaccine is only given in a clinic, pharmacy, doctor's office, or other health care setting and will not be stored at home. NOTE: This sheet is a summary. It may not cover all possible information. If you have questions about this medicine, talk to your doctor, pharmacist, or health care provider.  2020 Elsevier/Gold Standard (2017-07-26 13:20:30)  Vertigo Vertigo is the feeling that you or your surroundings are moving when they are not. This feeling can come and go at any time. Vertigo often goes away on its own. Vertigo can be dangerous if it occurs while you are doing something that could endanger you or others, such as driving or operating machinery. Your health care provider will do tests to determine the cause of your vertigo. Tests will also help your health care provider decide how best to treat your condition. Follow these instructions at home: Eating and drinking      Drink enough fluid to keep your urine pale yellow.  Do not drink alcohol. Activity  Return to your normal activities as told by your health care provider. Ask your health care provider what activities are safe for you.  In the morning, first sit up on the side of the bed. When you feel okay, stand slowly while you hold onto something until you know that your balance is fine.  Move slowly. Avoid sudden body or head movements or certain positions, as told by your health care provider.  If you have trouble walking or keeping your balance, try using a cane for stability. If you feel dizzy or unstable, sit down right away.  Avoid doing any tasks that would cause danger to you or others if vertigo occurs.  Avoid bending down if you feel dizzy. Place items in your home so that they are easy for you to reach without leaning over.  Do not drive or use heavy machinery if you feel dizzy. General instructions  Take over-the-counter and  prescription medicines only as told by your health care provider.  Keep all follow-up visits as told by your health care provider. This is important. Contact a health care provider if:  Your medicines do not relieve your vertigo or they make it worse.  You have a fever.  Your condition gets worse or you develop new symptoms.  Your family or friends notice any behavioral changes.  Your nausea or vomiting gets worse.  You have numbness or a prickling and tingling sensation in part of your body. Get help right away if you:  Have difficulty moving or speaking.  Are always dizzy.  Faint.  Develop severe headaches.  Have weakness in your hands, arms, or legs.  Have changes in your hearing or vision.  Develop a stiff neck.  Develop sensitivity to light. Summary  Vertigo is the feeling that you or your surroundings are moving when they are not.  Your health care provider will do tests to determine the cause of your vertigo.  Follow instructions for home care. You may be told to avoid certain tasks, positions, or movements.  Contact a  health care provider if your medicines do not relieve your symptoms, or if you have a fever, nausea, vomiting, or changes in behavior.  Get help right away if you have severe headaches or difficulty speaking, or you develop hearing or vision problems. This information is not intended to replace advice given to you by your health care provider. Make sure you discuss any questions you have with your health care provider. Document Revised: 11/07/2018 Document Reviewed: 11/07/2018 Elsevier Patient Education  2020 [employer].

## 2020-12-17 NOTE — Progress Notes (Signed)
Chief Complaint  Patient presents with  . Dizziness  . Immunizations   Acute visit  1. Room is spinning and lightly headed on and off x 1 month nothing tried he does drink alcohol nightly but will not quantify how much and does not skip meals  Orthostatics negative today lying 120/74 HR 57, sitting 126/80 HR 60, standing 112/80 HR 72  Denies hearing loss or recent URI He feels like this at work watching a jet engine spin makes him feel dizzy and at times with moving unsteady gait off balance lightheaded    Review of Systems  Constitutional: Negative for weight loss.  HENT: Negative for ear pain and hearing loss.   Eyes: Negative for blurred vision.  Respiratory: Negative for shortness of breath.   Cardiovascular: Negative for chest pain.  Gastrointestinal: Negative for abdominal pain.  Musculoskeletal: Negative for falls and joint pain.  Skin: Negative for rash.  Neurological: Positive for dizziness and sensory change. Negative for headaches.  Psychiatric/Behavioral: Negative for memory loss.   Past Medical History:  Diagnosis Date  . Heart murmur    Past Surgical History:  Procedure Laterality Date  . NASAL SINUS SURGERY  10 years ago   Family History  Problem Relation Age of Onset  . Heart disease Father   . Alcoholism Brother   . Diabetes Maternal Grandmother   . Diabetes Maternal Grandfather    Social History   Socioeconomic History  . Marital status: Married    Spouse name: Not on file  . Number of children: Not on file  . Years of education: Not on file  . Highest education level: Not on file  Occupational History  . Not on file  Tobacco Use  . Smoking status: Never Smoker  . Smokeless tobacco: Never Used  Substance and Sexual Activity  . Alcohol use: Yes    Alcohol/week: 6.0 standard drinks    Types: 6 Glasses of wine per week  . Drug use: No  . Sexual activity: Not on file  Other Topics Concern  . Not on file  Social History Narrative  . Not on  file   Social Determinants of Health   Financial Resource Strain: Not on file  Food Insecurity: Not on file  Transportation Needs: Not on file  Physical Activity: Not on file  Stress: Not on file  Social Connections: Not on file  Intimate Partner Violence: Not on file   Current Meds  Medication Sig  . cetirizine (ZYRTEC ALLERGY) 10 MG tablet Take 1 tablet (10 mg total) by mouth daily.  . Multiple Vitamin (MULTI-VITAMINS) TABS Take by mouth.  . Omega-3 Fatty Acids (FISH OIL) 1000 MG CAPS Take 1,000 mg by mouth.  . sildenafil (VIAGRA) 100 MG tablet Take by mouth.   No Known Allergies No results found for this or any previous visit (from the past 2160 hour(s)). Objective  Body mass index is 26.47 kg/m. Wt Readings from Last 3 Encounters:  12/17/20 189 lb 12.8 oz (86.1 kg)  01/17/19 191 lb 3.2 oz (86.7 kg)  10/05/18 188 lb 3.2 oz (85.4 kg)   Temp Readings from Last 3 Encounters:  12/17/20 98 F (36.7 C) (Oral)  10/05/18 97.6 F (36.4 C)  03/01/17 98.4 F (36.9 C) (Oral)   BP Readings from Last 3 Encounters:  01/17/19 126/78  10/05/18 120/72  03/01/17 122/82   Pulse Readings from Last 3 Encounters:  01/17/19 68  10/05/18 67  03/01/17 61    Physical Exam Vitals and nursing note  reviewed.  Constitutional:      Appearance: Normal appearance. He is well-developed, well-groomed and overweight.  HENT:     Head: Normocephalic and atraumatic.  Eyes:     Conjunctiva/sclera: Conjunctivae normal.     Pupils: Pupils are equal, round, and reactive to light.  Cardiovascular:     Rate and Rhythm: Normal rate and regular rhythm.     Heart sounds: Normal heart sounds. No murmur heard.   Pulmonary:     Effort: Pulmonary effort is normal.     Breath sounds: Normal breath sounds.  Abdominal:     General: Abdomen is flat. Bowel sounds are normal.     Tenderness: There is no abdominal tenderness.  Skin:    General: Skin is warm and dry.  Neurological:     General: No focal  deficit present.     Mental Status: He is alert and oriented to person, place, and time. Mental status is at baseline.     Gait: Gait normal.  Psychiatric:        Attention and Perception: Attention and perception normal.        Mood and Affect: Mood and affect normal.        Speech: Speech normal.        Behavior: Behavior normal. Behavior is cooperative.        Thought Content: Thought content normal.        Cognition and Memory: Cognition and memory normal.        Judgment: Judgment normal.     Assessment  Plan  Vertigo/dizziness/abnormal gait ? Related to etoh use daily rec increase hydration with water 55-64 ounces daily he is not orthostatic- Plan: CT Head Wo Contrast, DISCONTINUED: meclizine (ANTIVERT) 12.5 MG -25  Tablet mg bid d/c pt declined for now  Fasting labs today   Fatty liver - Plan: Hepatitis A Ab, Total, Hepatitis B surface antibody,quantitative S/p twinrix vaccine   Numbness and tingling of both upper extremities ? If related to alcohol use  rec take mvt with b12 1000 mcg qd, thiamine 100 mg qd, folic acid 1 gram qd   HM Flu shot declines  covid shot declines  Wants shingrix in future disc today and given info Tdap given today   Fatty liver check hep A/B immunity  Hep C neg  Declines HIV psa today  Consider colonoscopy in future with PCP discuss  At f/u 01/13/21      Provider: Dr. French Ana McLean-Scocuzza-Internal Medicine

## 2020-12-18 ENCOUNTER — Telehealth: Payer: Self-pay | Admitting: Internal Medicine

## 2020-12-18 LAB — CBC WITH DIFFERENTIAL/PLATELET
Basophils Absolute: 0.1 10*3/uL (ref 0.0–0.1)
Basophils Relative: 1.1 % (ref 0.0–3.0)
Eosinophils Absolute: 0.3 10*3/uL (ref 0.0–0.7)
Eosinophils Relative: 5.2 % — ABNORMAL HIGH (ref 0.0–5.0)
HCT: 45 % (ref 39.0–52.0)
Hemoglobin: 15.3 g/dL (ref 13.0–17.0)
Lymphocytes Relative: 29.2 % (ref 12.0–46.0)
Lymphs Abs: 1.5 10*3/uL (ref 0.7–4.0)
MCHC: 34 g/dL (ref 30.0–36.0)
MCV: 90.5 fl (ref 78.0–100.0)
Monocytes Absolute: 0.6 10*3/uL (ref 0.1–1.0)
Monocytes Relative: 10.8 % (ref 3.0–12.0)
Neutro Abs: 2.8 10*3/uL (ref 1.4–7.7)
Neutrophils Relative %: 53.7 % (ref 43.0–77.0)
Platelets: 253 10*3/uL (ref 150.0–400.0)
RBC: 4.97 Mil/uL (ref 4.22–5.81)
RDW: 12.8 % (ref 11.5–15.5)
WBC: 5.2 10*3/uL (ref 4.0–10.5)

## 2020-12-18 LAB — COMPREHENSIVE METABOLIC PANEL
ALT: 21 U/L (ref 0–53)
AST: 23 U/L (ref 0–37)
Albumin: 4.6 g/dL (ref 3.5–5.2)
Alkaline Phosphatase: 68 U/L (ref 39–117)
BUN: 12 mg/dL (ref 6–23)
CO2: 30 mEq/L (ref 19–32)
Calcium: 9.6 mg/dL (ref 8.4–10.5)
Chloride: 101 mEq/L (ref 96–112)
Creatinine, Ser: 1.04 mg/dL (ref 0.40–1.50)
GFR: 78.69 mL/min (ref >60.00)
Glucose, Bld: 82 mg/dL (ref 70–99)
Potassium: 4.4 mEq/L (ref 3.5–5.1)
Sodium: 138 mEq/L (ref 135–145)
Total Bilirubin: 0.8 mg/dL (ref 0.2–1.2)
Total Protein: 7.1 g/dL (ref 6.0–8.3)

## 2020-12-18 LAB — URINALYSIS, ROUTINE W REFLEX MICROSCOPIC
Bilirubin Urine: NEGATIVE
Glucose, UA: NEGATIVE
Hgb urine dipstick: NEGATIVE
Leukocytes,Ua: NEGATIVE
Nitrite: NEGATIVE
Protein, ur: NEGATIVE
Specific Gravity, Urine: 1.006 (ref 1.001–1.03)
pH: 6 (ref 5.0–8.0)

## 2020-12-18 LAB — LIPID PANEL
Cholesterol: 194 mg/dL (ref 0–200)
HDL: 55.1 mg/dL (ref >39.00)
LDL Cholesterol: 116 mg/dL — ABNORMAL HIGH (ref 0–99)
NonHDL: 139.17
Total CHOL/HDL Ratio: 4
Triglycerides: 114 mg/dL (ref 0.0–149.0)
VLDL: 22.8 mg/dL (ref 0.0–40.0)

## 2020-12-18 LAB — VITAMIN D 25 HYDROXY (VIT D DEFICIENCY, FRACTURES): VITD: 48.43 ng/mL (ref 30.00–100.00)

## 2020-12-18 LAB — PSA: PSA: 1.28 ng/mL (ref 0.10–4.00)

## 2020-12-18 LAB — TSH: TSH: 3.25 u[IU]/mL (ref 0.35–4.50)

## 2020-12-18 NOTE — Telephone Encounter (Signed)
Please give vanessa cigna form to get CT approved of head   Thanks TMS

## 2020-12-19 LAB — HEPATITIS B SURFACE ANTIBODY, QUANTITATIVE: Hep B S AB Quant (Post): 632 m[IU]/mL (ref >=10)

## 2020-12-19 LAB — HEPATITIS A ANTIBODY, TOTAL: Hepatitis A AB,Total: REACTIVE — AB

## 2020-12-19 NOTE — Telephone Encounter (Signed)
Forms given to Erie Noe to fax records; records were faxed yesterday afternoon.

## 2021-01-13 ENCOUNTER — Ambulatory Visit: Payer: Managed Care, Other (non HMO) | Admitting: Family Medicine

## 2021-01-24 ENCOUNTER — Telehealth: Payer: Self-pay | Admitting: Family Medicine

## 2021-01-24 ENCOUNTER — Telehealth: Payer: Self-pay

## 2021-01-24 NOTE — Telephone Encounter (Signed)
Good afternoon!  on 01/03 I spoke with to get scheduled for the CT Head pt stated he will wait until after he sees Dr Birdie Sons on 01/13/2021. ry

## 2021-01-24 NOTE — Telephone Encounter (Signed)
Please advise   I know Erie Noe had faxed paperwork to try and get this approved. Unsure where this is in the process now.

## 2021-01-24 NOTE — Telephone Encounter (Signed)
-----   Message from Bevelyn Buckles, MD sent at 12/17/2020  5:32 PM EST ----- Please sch CT head 12/18/20 pt prefers [facility] outpt imaging after 2 pm if not able tomorrow he wants to do another day same location please can do Friday anytime but they maybe closed

## 2021-01-27 NOTE — Telephone Encounter (Signed)
Noted  For your information   

## 2021-02-05 NOTE — Telephone Encounter (Signed)
No note needed 

## 2021-11-19 ENCOUNTER — Ambulatory Visit (INDEPENDENT_AMBULATORY_CARE_PROVIDER_SITE_OTHER): Payer: Managed Care, Other (non HMO) | Admitting: Family

## 2021-11-19 ENCOUNTER — Other Ambulatory Visit: Payer: Self-pay

## 2021-11-19 VITALS — BP 132/78 | HR 84 | Temp 97.7°F | Resp 18 | Ht 71.0 in | Wt 181.5 lb

## 2021-11-19 DIAGNOSIS — B9689 Other specified bacterial agents as the cause of diseases classified elsewhere: Secondary | ICD-10-CM

## 2021-11-19 DIAGNOSIS — J019 Acute sinusitis, unspecified: Secondary | ICD-10-CM

## 2021-11-19 MED ORDER — FLUTICASONE PROPIONATE 50 MCG/ACT NA SUSP: 2.0000 | Freq: Every day | NASAL | 6 refills | Status: DC

## 2021-11-19 MED ORDER — AMOXICILLIN-POT CLAVULANATE 875-125 MG PO TABS: 1.0000 | ORAL_TABLET | Freq: Two times a day (BID) | ORAL | 0 refills | Status: DC

## 2021-11-19 NOTE — Progress Notes (Signed)
Acute Office Visit  Subjective:    Patient ID: TRES [id number], male    DOB: March 02, 1961, 60 y.o.   MRN: [id number]  No chief complaint on file.   HPI Patient is in today with c/o cough, congestion, sinus pressure, yellow-green phlegm x 1 week and worsening. Has been irrigating is sinus cavity that helps some. No fever or chills.   Past Medical History:  Diagnosis Date  . Heart murmur     Past Surgical History:  Procedure Laterality Date  . NASAL SINUS SURGERY  10 years ago    Family History  Problem Relation Age of Onset  . Heart disease Father   . Alcoholism Brother   . Diabetes Maternal Grandmother   . Diabetes Maternal Grandfather     Social History   Socioeconomic History  . Marital status: Married    Spouse name: Not on file  . Number of children: Not on file  . Years of education: Not on file  . Highest education level: Not on file  Occupational History  . Not on file  Tobacco Use  . Smoking status: Never  . Smokeless tobacco: Never  Substance and Sexual Activity  . Alcohol use: Yes    Alcohol/week: 6.0 standard drinks    Types: 6 Glasses of wine per week  . Drug use: No  . Sexual activity: Not on file  Other Topics Concern  . Not on file  Social History Narrative  . Not on file   Social Determinants of Health   Financial Resource Strain: Not on file  Food Insecurity: Not on file  Transportation Needs: Not on file  Physical Activity: Not on file  Stress: Not on file  Social Connections: Not on file  Intimate Partner Violence: Not on file    Outpatient Medications Prior to Visit  Medication Sig Dispense Refill  . cetirizine (ZYRTEC ALLERGY) 10 MG tablet Take 1 tablet (10 mg total) by mouth daily. 30 tablet 6  . Multiple Vitamin (MULTI-VITAMINS) TABS Take by mouth.    . Omega-3 Fatty Acids (FISH OIL) 1000 MG CAPS Take 1,000 mg by mouth.    . sildenafil (VIAGRA) 100 MG tablet Take by mouth.     No facility-administered medications prior to  visit.    No Known Allergies  Review of Systems  Constitutional: Negative.   HENT:  Positive for congestion and postnasal drip.   Respiratory:  Positive for cough. Negative for shortness of breath.   Cardiovascular: Negative.   Musculoskeletal: Negative.   Allergic/Immunologic: Negative.   Neurological: Negative.   Psychiatric/Behavioral: Negative.    All other systems reviewed and are negative.     Objective:    Physical Exam Vitals and nursing note reviewed.  Constitutional:      Appearance: Normal appearance.  HENT:     Right Ear: Tympanic membrane and ear canal normal.     Left Ear: Tympanic membrane and ear canal normal.     Nose: Congestion present.     Mouth/Throat:     Mouth: Mucous membranes are moist.     Pharynx: No oropharyngeal exudate.  Cardiovascular:     Rate and Rhythm: Normal rate and regular rhythm.  Pulmonary:     Effort: Pulmonary effort is normal. No respiratory distress.     Breath sounds: Normal breath sounds.  Skin:    General: Skin is warm and dry.  Neurological:     General: No focal deficit present.     Mental Status: He is alert  and oriented to person, place, and time.  Psychiatric:        Mood and Affect: Mood normal.   There were no vitals taken for this visit. Wt Readings from Last 3 Encounters:  12/17/20 189 lb (85.7 kg)  01/17/19 191 lb 3.2 oz (86.7 kg)  10/05/18 188 lb 3.2 oz (85.4 kg)    Health Maintenance Due  Topic Date Due  . COVID-19 Vaccine (1) Never done  . COLONOSCOPY (Pts 45-35yrs Insurance coverage will need to be confirmed)  Never done  . Zoster Vaccines- Shingrix (1 of 2) Never done  . INFLUENZA VACCINE  07/28/2021    There are no preventive care reminders to display for this patient.   Lab Results  Component Value Date   TSH 3.25 12/17/2020   Lab Results  Component Value Date   WBC 5.2 12/17/2020   HGB 15.3 12/17/2020   HCT 45.0 12/17/2020   MCV 90.5 12/17/2020   PLT 253.0 12/17/2020   Lab Results   Component Value Date   NA 138 12/17/2020   K 4.4 12/17/2020   CO2 30 12/17/2020   GLUCOSE 82 12/17/2020   BUN 12 12/17/2020   CREATININE 1.04 12/17/2020   BILITOT 0.8 12/17/2020   ALKPHOS 68 12/17/2020   AST 23 12/17/2020   ALT 21 12/17/2020   PROT 7.1 12/17/2020   ALBUMIN 4.6 12/17/2020   CALCIUM 9.6 12/17/2020   ANIONGAP 12 04/10/2012   GFR 78.69 12/17/2020   Lab Results  Component Value Date   CHOL 194 12/17/2020   Lab Results  Component Value Date   HDL 55.10 12/17/2020   Lab Results  Component Value Date   LDLCALC 116 (H) 12/17/2020   Lab Results  Component Value Date   TRIG 114.0 12/17/2020   Lab Results  Component Value Date   CHOLHDL 4 12/17/2020   No results found for: HGBA1C     Assessment & Plan:   Problem List Items Addressed This Visit   None Visit Diagnoses     Acute bacterial sinusitis    -  Primary   Relevant Medications   amoxicillin-clavulanate (AUGMENTIN) 875-125 MG tablet   fluticasone (FLONASE) 50 MCG/ACT nasal spray        Meds ordered this encounter  Medications  . amoxicillin-clavulanate (AUGMENTIN) 875-125 MG tablet    Sig: Take 1 tablet by mouth 2 (two) times daily.    Dispense:  14 tablet    Refill:  0  . fluticasone (FLONASE) 50 MCG/ACT nasal spray    Sig: Place 2 sprays into both nostrils daily.    Dispense:  16 g    Refill:  6   Diagnoses and all orders for this visit:  Acute bacterial sinusitis  Other orders -     amoxicillin-clavulanate (AUGMENTIN) 875-125 MG tablet; Take 1 tablet by mouth 2 (two) times daily. -     fluticasone (FLONASE) 50 MCG/ACT nasal spray; Place 2 sprays into both nostrils daily.   Call the office if symptoms worsen or persist. Recheck as scheduled and as needed.   Eulis Foster, FNP

## 2022-04-17 ENCOUNTER — Encounter: Payer: Self-pay | Admitting: Cardiology

## 2022-04-17 ENCOUNTER — Ambulatory Visit (INDEPENDENT_AMBULATORY_CARE_PROVIDER_SITE_OTHER): Payer: Managed Care, Other (non HMO) | Admitting: Cardiology

## 2022-04-17 ENCOUNTER — Other Ambulatory Visit
Admission: RE | Admit: 2022-04-17 | Discharge: 2022-04-17 | Disposition: A | Discharge status: Home / Self Care | Payer: Managed Care, Other (non HMO) | Attending: Cardiology | Admitting: Cardiology

## 2022-04-17 VITALS — BP 118/66 | HR 68 | Ht 70.0 in | Wt 175.0 lb

## 2022-04-17 DIAGNOSIS — R072 Precordial pain: Secondary | ICD-10-CM

## 2022-04-17 DIAGNOSIS — R0609 Other forms of dyspnea: Secondary | ICD-10-CM

## 2022-04-17 LAB — BASIC METABOLIC PANEL
Anion gap: 10 (ref 5–15)
BUN: 23 mg/dL — ABNORMAL HIGH (ref 6–20)
CO2: 26 mmol/L (ref 22–32)
Calcium: 9.2 mg/dL (ref 8.9–10.3)
Chloride: 104 mmol/L (ref 98–111)
Creatinine, Ser: 1.13 mg/dL (ref 0.61–1.24)
GFR, Estimated: >60 mL/min (ref >60)
Glucose, Bld: 89 mg/dL (ref 70–99)
Potassium: 4 mmol/L (ref 3.5–5.1)
Sodium: 140 mmol/L (ref 135–145)

## 2022-04-17 MED ORDER — METOPROLOL TARTRATE 100 MG PO TABS: 100.0000 mg | ORAL_TABLET | Freq: Once | ORAL | 0 refills | Status: DC

## 2022-04-17 NOTE — Progress Notes (Signed)
?Cardiology Office Note:   ? ?Date:  04/17/2022  ? ?ID:  Christopher Gill, DOB 1961-07-21, MRN [id number] ? ?PCP:  Glori Luis, MD ?  ?[facility] Providers ?Cardiologist:  None    ? ?Referring MD: Glori Luis, MD  ? ?Chief Complaint  ?Patient presents with  ? New Patient (Initial Visit)  ?  Self referral -- Swelling in ankles. Meds reviewed verbally with patient.   ? ? ? ? ?History of Present Illness:   ? ?Christopher Gill is a 61 y.o. male with no significant past medical history who presents due to chest pain or shortness of breath.  Patient states having chest pressure about a month ago for 1 week.  Symptoms not related with exertion.  Also endorsed shortness of breath with exertion and some leg edema during that time..  Family told him to follow-up with cardiology to get further work-up.  He states he is better, his symptoms have improved, denies edema, states breathing better.  Denies smoking or personal history of heart disease.  Both parents have a history of heart valve replacement. ? ?Past Medical History:  ?Diagnosis Date  ? Heart murmur   ? ? ?Past Surgical History:  ?Procedure Laterality Date  ? NASAL SINUS SURGERY  10 years ago  ? ? ?Current Medications: ?Current Meds  ?Medication Sig  ? cetirizine (ZYRTEC ALLERGY) 10 MG tablet Take 1 tablet (10 mg total) by mouth daily.  ? fluticasone (FLONASE) 50 MCG/ACT nasal spray Place 2 sprays into both nostrils daily.  ? metoprolol tartrate (LOPRESSOR) 100 MG tablet Take 1 tablet (100 mg total) by mouth once for 1 dose. Take 2 hours prior to your CT scan.  ? Multiple Vitamin (MULTI-VITAMINS) TABS Take by mouth.  ? Omega-3 Fatty Acids (FISH OIL) 1000 MG CAPS Take 1,000 mg by mouth.  ? sildenafil (VIAGRA) 100 MG tablet Take by mouth.  ?  ? ?Allergies:   Patient has no known allergies.  ? ?Social History  ? ?Socioeconomic History  ? Marital status: Married  ?  Spouse name: Not on file  ? Number of children: Not on file  ? Years of education: Not on  file  ? Highest education level: Not on file  ?Occupational History  ? Not on file  ?Tobacco Use  ? Smoking status: Never  ? Smokeless tobacco: Never  ?Substance and Sexual Activity  ? Alcohol use: Yes  ?  Alcohol/week: 6.0 standard drinks  ?  Types: 6 Glasses of wine per week  ? Drug use: No  ? Sexual activity: Not on file  ?Other Topics Concern  ? Not on file  ?Social History Narrative  ? Not on file  ? ?Social Determinants of Health  ? ?Financial Resource Strain: Not on file  ?Food Insecurity: Not on file  ?Transportation Needs: Not on file  ?Physical Activity: Not on file  ?Stress: Not on file  ?Social Connections: Not on file  ?  ? ?Family History: ?The patient's family history includes Alcoholism in his brother; Diabetes in his maternal grandfather and maternal grandmother; Heart disease in his father. ? ?ROS:   ?Please see the history of present illness.    ? All other systems reviewed and are negative. ? ?EKGs/Labs/Other Studies Reviewed:   ? ?The following studies were reviewed today: ? ? ?EKG:  EKG is  ordered today.  The ekg ordered today demonstrates normal sinus rhythm, normal ECG. ? ?Recent Labs: ?04/17/2022: BUN 23; Creatinine, Ser 1.13; Potassium 4.0; Sodium 140  ?  Recent Lipid Panel ?   ?Component Value Date/Time  ? CHOL 194 12/17/2020 1649  ? TRIG 114.0 12/17/2020 1649  ? HDL 55.10 12/17/2020 1649  ? CHOLHDL 4 12/17/2020 1649  ? VLDL 22.8 12/17/2020 1649  ? LDLCALC 116 (H) 12/17/2020 1649  ? ? ? ?Risk Assessment/Calculations:   ? ? ?    ? ?Physical Exam:   ? ?VS:  BP 118/66 (BP Location: Left Arm, Patient Position: Sitting, Cuff Size: Normal)   Pulse 68   Ht 5\' 10"  (1.778 m)   Wt 175 lb (79.4 kg)   SpO2 98%   BMI 25.11 kg/m?    ? ?Wt Readings from Last 3 Encounters:  ?04/17/22 175 lb (79.4 kg)  ?11/19/21 181 lb 8 oz (82.3 kg)  ?12/17/20 189 lb (85.7 kg)  ?  ? ?GEN:  Well nourished, well developed in no acute distress ?HEENT: Normal ?NECK: No JVD; No carotid bruits ?LYMPHATICS: No  lymphadenopathy ?CARDIAC: RRR, no murmurs, rubs, gallops ?RESPIRATORY:  Clear to auscultation without rales, wheezing or rhonchi  ?ABDOMEN: Soft, non-tender, non-distended ?MUSCULOSKELETAL:  No edema; No deformity  ?SKIN: Warm and dry ?NEUROLOGIC:  Alert and oriented x 3 ?PSYCHIATRIC:  Normal affect  ? ?ASSESSMENT:   ? ?1. Precordial pain   ?2. Dyspnea on exertion   ? ?PLAN:   ? ?In order of problems listed above: ? ?Chest pain, get echocardiogram, get coronary CTA to evaluate presence of CAD. ?Dyspnea on exertion, echo and coronary CTA as scheduled. ? ?Follow-up after cardiac testing. ?   ? ? ?Medication Adjustments/Labs and Tests Ordered: ?Current medicines are reviewed at length with the patient today.  Concerns regarding medicines are outlined above.  ?Orders Placed This Encounter  ?Procedures  ? CT CORONARY MORPH W/CTA COR W/SCORE W/CA W/CM &/OR WO/CM  ? Basic metabolic panel  ? EKG 12-Lead  ? ECHOCARDIOGRAM COMPLETE  ? ?Meds ordered this encounter  ?Medications  ? metoprolol tartrate (LOPRESSOR) 100 MG tablet  ?  Sig: Take 1 tablet (100 mg total) by mouth once for 1 dose. Take 2 hours prior to your CT scan.  ?  Dispense:  1 tablet  ?  Refill:  0  ? ? ?Patient Instructions  ?Medication Instructions:  ? ?Your physician recommends that you continue on your current medications as directed. Please refer to the Current Medication list given to you today. ? ?*If you need a refill on your cardiac medications before your next appointment, please call your pharmacy* ? ? ?Lab Work: ? ?BMP to be drawn at medical mall after appointment today. ? ?If you have labs (blood work) drawn today and your tests are completely normal, you will receive your results only by: ?MyChart Message (if you have MyChart) OR ?A paper copy in the mail ?If you have any lab test that is abnormal or we need to change your treatment, we will call you to review the results. ? ? ?Testing/Procedures: ? ?Your physician has requested that you have an  echocardiogram. Echocardiography is a painless test that uses sound waves to create images of your heart. It provides your doctor with information about the size and shape of your heart and how well your heart?s chambers and valves are working. This procedure takes approximately one hour. There are no restrictions for this procedure. ? ?2.   Your physician has requested that you have cardiac CT. Cardiac computed tomography (CT) is a painless test that uses an x-ray machine to take clear, detailed pictures of your heart.  ? ? ?  Your cardiac CT will be scheduled at: ? ?[facility] ?[address] ?[address] ?[place], [place] [address] ?([phone] ? ?Monday 04/20/22  at 3:30 ? ?Please arrive 15 mins early for check-in and test prep. ? ? ? ?Please follow these instructions carefully (unless otherwise directed): ? ? ? ?On the Night Before the Test: ?Be sure to Drink plenty of water. ?Do not consume any caffeinated/decaffeinated beverages or chocolate 12 hours prior to your test. ? ? ? ?On the Day of the Test: ?Drink plenty of water until 1 hour prior to the test. ?Do not eat any food 4 hours prior to the test. ?You may take your regular medications prior to the test.  ?Take metoprolol (Lopressor) 100 MG two hours prior to test. ? ? ?     ?After the Test: ?Drink plenty of water. ?After receiving IV contrast, you may experience a mild flushed feeling. This is normal. ?On occasion, you may experience a mild rash up to 24 hours after the test. This is not dangerous. If this occurs, you can take Benadryl 25 mg and increase your fluid intake. ?If you experience trouble breathing, this can be serious. If it is severe call 911 IMMEDIATELY. If it is mild, please call our office. ?If you take any of these medications: Glipizide/Metformin, Avandament, Glucavance, please do not take 48 hours after completing test unless otherwise instructed. ? ?Please allow 2-4 weeks for scheduling of routine  cardiac CTs. Some insurance companies require a pre-authorization which may delay scheduling of this test.  ? ?For non-scheduling related questions, please contact the cardiac imaging nurse navigator should you have any questions/co

## 2022-04-17 NOTE — Patient Instructions (Signed)
Medication Instructions:  ? ?Your physician recommends that you continue on your current medications as directed. Please refer to the Current Medication list given to you today. ? ?*If you need a refill on your cardiac medications before your next appointment, please call your pharmacy* ? ? ?Lab Work: ? ?BMP to be drawn at medical mall after appointment today. ? ?If you have labs (blood work) drawn today and your tests are completely normal, you will receive your results only by: ?MyChart Message (if you have MyChart) OR ?A paper copy in the mail ?If you have any lab test that is abnormal or we need to change your treatment, we will call you to review the results. ? ? ?Testing/Procedures: ? ?Your physician has requested that you have an echocardiogram. Echocardiography is a painless test that uses sound waves to create images of your heart. It provides your doctor with information about the size and shape of your heart and how well your heart?s chambers and valves are working. This procedure takes approximately one hour. There are no restrictions for this procedure. ? ?2.   Your physician has requested that you have cardiac CT. Cardiac computed tomography (CT) is a painless test that uses an x-ray machine to take clear, detailed pictures of your heart.  ? ? ?Your cardiac CT will be scheduled at: ? ?[facility] ?[address] ?[address] ?[place], [place] [address] ?[phone] ? ?Monday 04/20/22  at 3:30 ? ?Please arrive 15 mins early for check-in and test prep. ? ? ? ?Please follow these instructions carefully (unless otherwise directed): ? ? ? ?On the Night Before the Test: ?Be sure to Drink plenty of water. ?Do not consume any caffeinated/decaffeinated beverages or chocolate 12 hours prior to your test. ? ? ? ?On the Day of the Test: ?Drink plenty of water until 1 hour prior to the test. ?Do not eat any food 4 hours prior to the test. ?You may take your regular medications prior  to the test.  ?Take metoprolol (Lopressor) 100 MG two hours prior to test. ? ? ?     ?After the Test: ?Drink plenty of water. ?After receiving IV contrast, you may experience a mild flushed feeling. This is normal. ?On occasion, you may experience a mild rash up to 24 hours after the test. This is not dangerous. If this occurs, you can take Benadryl 25 mg and increase your fluid intake. ?If you experience trouble breathing, this can be serious. If it is severe call 911 IMMEDIATELY. If it is mild, please call our office. ?If you take any of these medications: Glipizide/Metformin, Avandament, Glucavance, please do not take 48 hours after completing test unless otherwise instructed. ? ?Please allow 2-4 weeks for scheduling of routine cardiac CTs. Some insurance companies require a pre-authorization which may delay scheduling of this test.  ? ?For non-scheduling related questions, please contact the cardiac imaging nurse navigator should you have any questions/concerns: ?Rockwell Alexandria, Cardiac Imaging Nurse Navigator ?Larey Brick, Cardiac Imaging Nurse Navigator ?[facility] ?Direct Office Dial: [phone]  ? ?For scheduling needs, including cancellations and rescheduling, please call [place], [phone].   ? ? ?Follow-Up: ?At [facility], you and your health needs are our priority.  As part of our continuing mission to provide you with exceptional heart care, we have created designated Provider Care Teams.  These Care Teams include your primary Cardiologist (physician) and Advanced Practice Providers (APPs -  Physician Assistants and Nurse Practitioners) who all work  together to provide you with the care you need, when you need it. ? ?We recommend signing up for the patient portal called "MyChart".  Sign up information is provided on this After Visit Summary.  MyChart is used to connect with patients for Virtual Visits (Telemedicine).  Patients are able to view lab/test results,  encounter notes, upcoming appointments, etc.  Non-urgent messages can be sent to your provider as well.   ?To learn more about what you can do with MyChart, go to [email or web address].   ? ?Your next appointment:   ?Follow up after testing   ? ?The format for your next appointment:   ?In Person ? ?Provider:   ?You may see Debbe Odea, MD or one of the following Advanced Practice Providers on your designated Care Team:   ?Nicolasa Ducking, NP ?Eula Listen, PA-C ?Cadence Fransico Michael, PA-C  ? ? ?Other Instructions ? ? ?Important Information About Sugar ? ? ? ? ? ? ?

## 2022-04-20 ENCOUNTER — Ambulatory Visit
Admission: RE | Admit: 2022-04-20 | Discharge: 2022-04-20 | Disposition: A | Discharge status: Home / Self Care | Payer: Managed Care, Other (non HMO) | Source: Ambulatory Visit | Attending: Cardiology | Admitting: Cardiology

## 2022-04-24 ENCOUNTER — Telehealth (HOSPITAL_COMMUNITY): Payer: Self-pay | Admitting: Emergency Medicine

## 2022-04-24 NOTE — Telephone Encounter (Signed)
Reaching out to patient to offer assistance regarding upcoming cardiac imaging study; pt verbalizes understanding of appt date/time, parking situation and where to check in, pre-test NPO status and medications ordered, and verified current allergies; name and call back number provided for further questions should they arise ?Rockwell Alexandria RN Navigator Cardiac Imaging ?[facility] ?[phone] office ?[phone] cell ? ?Holding viagra x 2 days ?100mg  metoprolol tartrate ?Arrival 315 ?

## 2022-04-27 ENCOUNTER — Ambulatory Visit
Admission: RE | Admit: 2022-04-27 | Discharge: 2022-04-27 | Disposition: A | Discharge status: Home / Self Care | Payer: Managed Care, Other (non HMO) | Source: Ambulatory Visit | Attending: Cardiology | Admitting: Cardiology

## 2022-04-27 DIAGNOSIS — R072 Precordial pain: Secondary | ICD-10-CM | POA: Insufficient documentation

## 2022-04-27 MED ORDER — NITROGLYCERIN 0.4 MG SL SUBL
0.8000 mg | SUBLINGUAL_TABLET | Freq: Once | SUBLINGUAL | Status: AC
  Administered 2022-04-27: 0.8 mg via SUBLINGUAL

## 2022-04-27 MED ORDER — IOHEXOL 350 MG/ML SOLN
75.0000 mL | Freq: Once | INTRAVENOUS | Status: AC | PRN
  Administered 2022-04-27: 75 mL via INTRAVENOUS

## 2022-04-27 NOTE — Progress Notes (Signed)
Patient tolerated procedure well. Ambulate w/o difficulty. Denies light headedness or being dizzy. Sitting in chair drinking water provided. Encouraged to drink extra water today and reasoning explained. Verbalized understanding. All questions answered. ABC intact. No further needs. Discharge from procedure area w/o issues.   °

## 2022-05-06 ENCOUNTER — Ambulatory Visit (INDEPENDENT_AMBULATORY_CARE_PROVIDER_SITE_OTHER): Payer: Managed Care, Other (non HMO)

## 2022-05-06 DIAGNOSIS — R0609 Other forms of dyspnea: Secondary | ICD-10-CM

## 2022-05-06 DIAGNOSIS — R072 Precordial pain: Secondary | ICD-10-CM

## 2022-05-06 LAB — ECHOCARDIOGRAM COMPLETE
AR max vel: 3.36 cm2
AV Area VTI: 3.28 cm2
AV Area mean vel: 3.25 cm2
AV Mean grad: 5 mmHg
AV Peak grad: 8.8 mmHg
AV Vena cont: 0.55 cm
Ao pk vel: 1.48 m/s
Area-P 1/2: 3.08 cm2
Calc EF: 73.7 %
S' Lateral: 2.2 cm
Single Plane A2C EF: 74.6 %
Single Plane A4C EF: 71.9 %

## 2022-06-01 ENCOUNTER — Ambulatory Visit: Payer: Managed Care, Other (non HMO) | Admitting: Cardiology

## 2023-04-09 ENCOUNTER — Ambulatory Visit (INDEPENDENT_AMBULATORY_CARE_PROVIDER_SITE_OTHER): Payer: 59 | Admitting: Family Medicine

## 2023-04-09 ENCOUNTER — Encounter: Payer: Self-pay | Admitting: Family Medicine

## 2023-04-09 ENCOUNTER — Ambulatory Visit (INDEPENDENT_AMBULATORY_CARE_PROVIDER_SITE_OTHER): Payer: 59

## 2023-04-09 VITALS — BP 116/80 | HR 65 | Temp 98.0°F | Ht 70.0 in | Wt 174.6 lb

## 2023-04-09 DIAGNOSIS — R200 Anesthesia of skin: Secondary | ICD-10-CM

## 2023-04-09 DIAGNOSIS — R202 Paresthesia of skin: Secondary | ICD-10-CM

## 2023-04-09 DIAGNOSIS — E785 Hyperlipidemia, unspecified: Secondary | ICD-10-CM

## 2023-04-09 HISTORY — DX: Anesthesia of skin: R20.0

## 2023-04-09 LAB — CBC
HCT: 43.6 % (ref 39.0–52.0)
Hemoglobin: 14.7 g/dL (ref 13.0–17.0)
MCHC: 33.8 g/dL (ref 30.0–36.0)
MCV: 91.7 fl (ref 78.0–100.0)
Platelets: 257 10*3/uL (ref 150.0–400.0)
RBC: 4.75 Mil/uL (ref 4.22–5.81)
RDW: 13.8 % (ref 11.5–15.5)
WBC: 5 10*3/uL (ref 4.0–10.5)

## 2023-04-09 LAB — LIPID PANEL
Cholesterol: 215 mg/dL — ABNORMAL HIGH (ref 0–200)
HDL: 75.6 mg/dL (ref >39.00)
LDL Cholesterol: 121 mg/dL — ABNORMAL HIGH (ref 0–99)
NonHDL: 139.84
Total CHOL/HDL Ratio: 3
Triglycerides: 93 mg/dL (ref 0.0–149.0)
VLDL: 18.6 mg/dL (ref 0.0–40.0)

## 2023-04-09 LAB — COMPREHENSIVE METABOLIC PANEL
ALT: 18 U/L (ref 0–53)
AST: 23 U/L (ref 0–37)
Albumin: 4.5 g/dL (ref 3.5–5.2)
Alkaline Phosphatase: 65 U/L (ref 39–117)
BUN: 10 mg/dL (ref 6–23)
CO2: 32 mEq/L (ref 19–32)
Calcium: 9.6 mg/dL (ref 8.4–10.5)
Chloride: 101 mEq/L (ref 96–112)
Creatinine, Ser: 0.95 mg/dL (ref 0.40–1.50)
GFR: 86.31 mL/min (ref >60.00)
Glucose, Bld: 103 mg/dL — ABNORMAL HIGH (ref 70–99)
Potassium: 4.2 mEq/L (ref 3.5–5.1)
Sodium: 139 mEq/L (ref 135–145)
Total Bilirubin: 0.6 mg/dL (ref 0.2–1.2)
Total Protein: 6.8 g/dL (ref 6.0–8.3)

## 2023-04-09 LAB — TSH: TSH: 2.67 u[IU]/mL (ref 0.35–5.50)

## 2023-04-09 LAB — HEMOGLOBIN A1C: Hgb A1c MFr Bld: 5.3 % (ref 4.6–6.5)

## 2023-04-09 NOTE — Patient Instructions (Signed)
Nice to see you. If you develop persistent numbness, or weakness, or any new symptoms please seek medical attention immediately. Will get lab work today and imaging and contact you with the results.

## 2023-04-09 NOTE — Assessment & Plan Note (Signed)
Focal numbness and tingling in left arm.  This could be related to what ever has caused the numbness in his face previously though could also be related to nerve impingement in his neck.  Will see what his MRI brain shows and we will also get an x-ray of his neck today.

## 2023-04-09 NOTE — Progress Notes (Signed)
Marikay Alar, MD Phone: [phone]  Christopher Gill is a 62 y.o. male who presents today for same day visit.   Numbness: Patient notes 3 weeks ago he started to have some numbness in his left upper arm at the base of his deltoid muscle.  He notes it is not just a pinpoint area.  It comes and goes.  He notes no radiating pain from his neck.  He did have some decreased range of motion in his neck and was seeing a chiropractor though that has improved.  He notes no weakness anywhere.  He does note he had some numbness in the V2 and V3 distribution of the left side of his face 1 week ago.  It occurred 1-2 times and lasted very shortly.  Has not recurred over the last week.  Social History   Tobacco Use  Smoking Status Never  Smokeless Tobacco Never    Current Outpatient Medications on File Prior to Visit  Medication Sig Dispense Refill   Multiple Vitamin (MULTI-VITAMINS) TABS Take by mouth.     Omega-3 Fatty Acids (FISH OIL) 1000 MG CAPS Take 1,000 mg by mouth.     sildenafil (VIAGRA) 100 MG tablet Take by mouth.     No current facility-administered medications on file prior to visit.     ROS see history of present illness  Objective  Physical Exam Vitals:   04/09/23 1101  BP: 116/80  Pulse: 65  Temp: 98 F (36.7 C)  SpO2: 98%    BP Readings from Last 3 Encounters:  04/09/23 116/80  04/27/22 117/62  04/17/22 118/66   Wt Readings from Last 3 Encounters:  04/09/23 174 lb 9.6 oz (79.2 kg)  04/17/22 175 lb (79.4 kg)  11/19/21 181 lb 8 oz (82.3 kg)    Physical Exam Constitutional:      General: He is not in acute distress.    Appearance: He is not diaphoretic.  Cardiovascular:     Rate and Rhythm: Normal rate and regular rhythm.     Heart sounds: Normal heart sounds.  Pulmonary:     Effort: Pulmonary effort is normal.     Breath sounds: Normal breath sounds.  Skin:    General: Skin is warm and dry.  Neurological:     Mental Status: He is alert.      Comments: CN 3-12 intact, 5/5 strength in bilateral biceps, triceps, grip, quads, hamstrings, plantar and dorsiflexion, sensation to light touch intact in bilateral UE and LE, normal gait, 2+ patellar reflexes      Assessment/Plan: Please see individual problem list.  Numbness of face Assessment & Plan: Occurred 1 week ago and has not recurred.  Discussed my concern for TIA versus stroke.  We will get an MRI today to evaluate further.  Advised if he developed any new numbness, weakness, or any facial changes before or after the MRI he should go to the emergency department.  Check lab work today.  Orders: -     MR BRAIN WO CONTRAST; Future -     Comprehensive metabolic panel -     Lipid panel -     Hemoglobin A1c -     TSH -     CBC  Numbness and tingling in left arm Assessment & Plan: Focal numbness and tingling in left arm.  This could be related to what ever has caused the numbness in his face previously though could also be related to nerve impingement in his neck.  Will see what his  MRI brain shows and we will also get an x-ray of his neck today.  Orders: -     DG Cervical Spine Complete; Future  Hyperlipidemia, unspecified hyperlipidemia type -     Comprehensive metabolic panel -     Lipid panel    No follow-ups on file.   Marikay Alar, MD [facility]

## 2023-04-09 NOTE — Assessment & Plan Note (Addendum)
Occurred 1 week ago and has not recurred.  Discussed my concern for TIA versus stroke.  We will get an MRI today to evaluate further.  Advised if he developed any new numbness, weakness, or any facial changes before or after the MRI he should go to the emergency department.  Check lab work today.

## 2023-04-10 ENCOUNTER — Ambulatory Visit
Admission: RE | Admit: 2023-04-10 | Discharge: 2023-04-10 | Disposition: A | Discharge status: Home / Self Care | Payer: 59 | Source: Ambulatory Visit | Attending: Family Medicine | Admitting: Family Medicine

## 2023-04-10 DIAGNOSIS — R200 Anesthesia of skin: Secondary | ICD-10-CM | POA: Insufficient documentation

## 2023-04-13 ENCOUNTER — Other Ambulatory Visit: Payer: Self-pay | Admitting: Family Medicine

## 2023-04-13 DIAGNOSIS — G459 Transient cerebral ischemic attack, unspecified: Secondary | ICD-10-CM

## 2023-04-20 ENCOUNTER — Ambulatory Visit
Admission: RE | Admit: 2023-04-20 | Discharge: 2023-04-20 | Disposition: A | Discharge status: Home / Self Care | Payer: 59 | Source: Ambulatory Visit | Attending: Family Medicine | Admitting: Family Medicine

## 2023-04-20 DIAGNOSIS — G459 Transient cerebral ischemic attack, unspecified: Secondary | ICD-10-CM | POA: Insufficient documentation

## 2023-05-12 ENCOUNTER — Ambulatory Visit: Payer: 59

## 2023-05-14 ENCOUNTER — Ambulatory Visit
Admission: RE | Admit: 2023-05-14 | Discharge: 2023-05-14 | Disposition: A | Discharge status: Home / Self Care | Payer: 59 | Source: Ambulatory Visit | Attending: Family Medicine | Admitting: Family Medicine

## 2023-05-14 DIAGNOSIS — R011 Cardiac murmur, unspecified: Secondary | ICD-10-CM | POA: Insufficient documentation

## 2023-05-14 DIAGNOSIS — G459 Transient cerebral ischemic attack, unspecified: Secondary | ICD-10-CM | POA: Diagnosis present

## 2023-05-14 LAB — ECHOCARDIOGRAM COMPLETE
AR max vel: 3.04 cm2
AV Area VTI: 3.28 cm2
AV Area mean vel: 2.98 cm2
AV Mean grad: 3 mmHg
AV Peak grad: 4.8 mmHg
Ao pk vel: 1.1 m/s
Area-P 1/2: 2.52 cm2
MV VTI: 2.17 cm2

## 2023-05-14 NOTE — Progress Notes (Signed)
*  PRELIMINARY RESULTS* Echocardiogram 2D Echocardiogram has been performed.  Cristela Blue 05/14/2023, 10:24 AM

## 2023-07-12 ENCOUNTER — Ambulatory Visit: Payer: 59 | Admitting: Family Medicine

## 2024-01-21 IMAGING — CT CT HEART MORP W/ CTA COR W/ SCORE W/ CA W/CM &/OR W/O CM
1 of 14 series · 3 of 20 positions shown, 4 images · non-contrast
Comparison: 04/10/2012.

Addendum:
CLINICAL DATA: Chest pain

EXAM:
Cardiac/Coronary  CTA
TECHNIQUE: The patient was scanned on a Siemens Somatom go.Top scanner.

[Series 4: multiphase % cta coronary 0.60 · axial · 0.34mm/px · z∈[-1162,-1094]mm · 3 of 3729 slices shown, 4 images]
[im 933/3729  vessel]
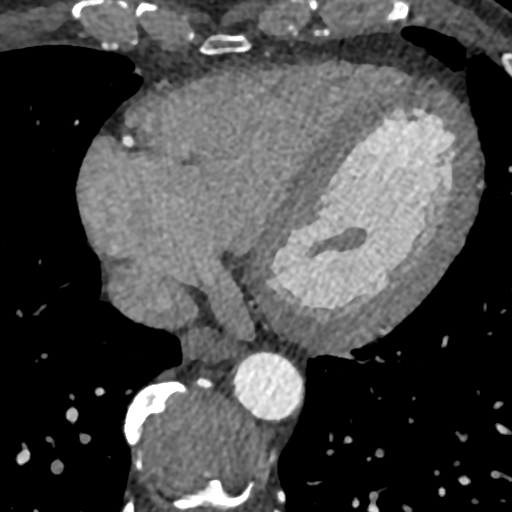
[im 933/3729  lung]
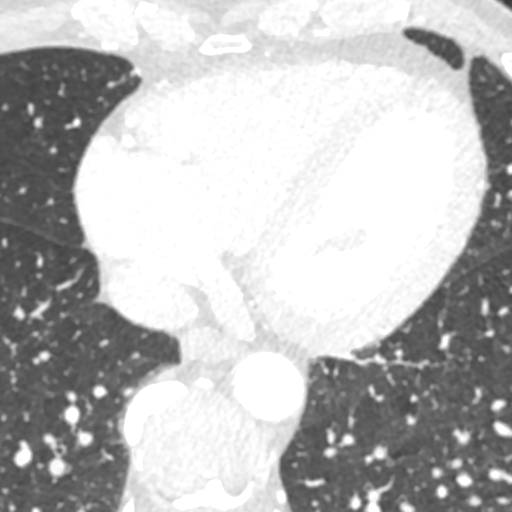
[im 1865/3729  vessel]
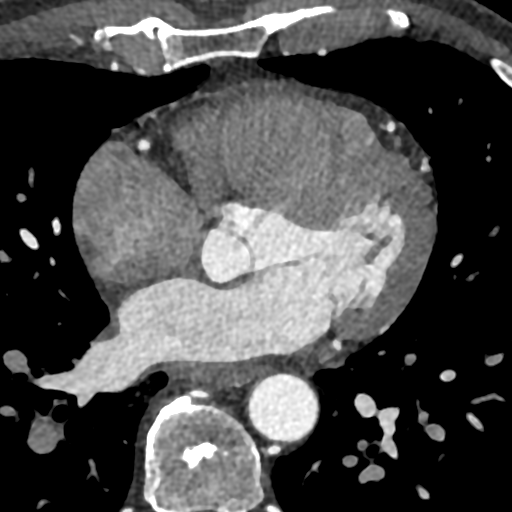
[im 2797/3729  vessel]
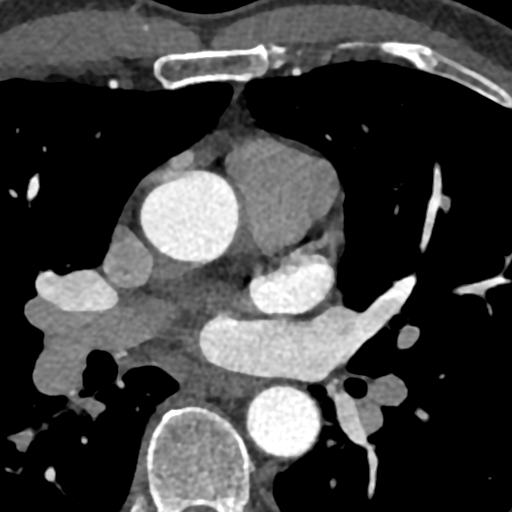

[3 of 20 positions shown; findings below may reference images not displayed]



Aortic Valve:  Trileaflet.  No calcifications.

Coronary Arteries:  Normal coronary origin.  Right dominance.

RCA is a dominant artery that gives rise to PDA and PLA. There is no
plaque.

Left main gives rise to LAD and LCX arteries.  LM has no disease.

LAD has no plaque.

LCX is a non-dominant artery that gives rise to two obtuse marginal
branches. There is no plaque.

Other findings:

Normal pulmonary vein drainage into the left atrium.

Normal left atrial appendage without a thrombus.

Normal size of the pulmonary artery.
IMPRESSION: 1. Normal coronary calcium score of 0. Patient is low risk for
coronary events.

2. Normal coronary origin with right dominance.

3. No evidence of CAD.

4. CAD-RADS 0. Consider non-atherosclerotic causes of chest pain.

EXAM:
OVER-READ INTERPRETATION  CT CHEST

The following report is an over-read performed by radiologist Dr.
does not include interpretation of cardiac or coronary anatomy or
pathology. The coronary calcium score/coronary CTA interpretation by
the cardiologist is attached.
FINDINGS: Mediastinum/nodes: No enlarged mediastinal or hilar lymph nodes
identified. No mediastinal mass. Visualized portions of the distal
esophagus and lower airway are unremarkable.

Lungs/pleura: No pleural effusion, airspace consolidation, or
atelectasis. Nausea perifissural nodule within the right middle lobe
is unchanged measuring 5 mm, image [DATE]. Subpleural nodule within
the right middle lobe is also unchanged measuring 5 mm, image [DATE].
Inferior right middle lobe subpleural nodule measures 7 mm, image
33/8. Also unchanged.

Upper abdomen: No acute abnormality.

Musculoskeletal: No acute or suspicious osseous findings.
IMPRESSION: 1. Right middle lobe lung nodules are stable compared with
04/10/2012 compatible with a benign process. No follow-up
recommended.



Aortic Valve:  Trileaflet.  No calcifications.

Coronary Arteries:  Normal coronary origin.  Right dominance.

RCA is a dominant artery that gives rise to PDA and PLA. There is no
plaque.

Left main gives rise to LAD and LCX arteries.  LM has no disease.

LAD has no plaque.

LCX is a non-dominant artery that gives rise to two obtuse marginal
branches. There is no plaque.

Other findings:

Normal pulmonary vein drainage into the left atrium.

Normal left atrial appendage without a thrombus.

Normal size of the pulmonary artery.
IMPRESSION: 1. Normal coronary calcium score of 0. Patient is low risk for
coronary events.

2. Normal coronary origin with right dominance.

3. No evidence of CAD.

4. CAD-RADS 0. Consider non-atherosclerotic causes of chest pain.

## 2024-07-28 ENCOUNTER — Encounter

## 2024-07-28 ENCOUNTER — Ambulatory Visit (INDEPENDENT_AMBULATORY_CARE_PROVIDER_SITE_OTHER)

## 2024-07-28 VITALS — BP 102/66 | HR 58 | Temp 98.3°F | Ht 72.0 in | Wt 184.8 lb

## 2024-07-28 DIAGNOSIS — E785 Hyperlipidemia, unspecified: Secondary | ICD-10-CM | POA: Diagnosis not present

## 2024-07-28 DIAGNOSIS — R200 Anesthesia of skin: Secondary | ICD-10-CM

## 2024-07-28 DIAGNOSIS — E871 Hypo-osmolality and hyponatremia: Secondary | ICD-10-CM | POA: Diagnosis not present

## 2024-07-28 DIAGNOSIS — Z125 Encounter for screening for malignant neoplasm of prostate: Secondary | ICD-10-CM | POA: Diagnosis not present

## 2024-07-28 DIAGNOSIS — R7401 Elevation of levels of liver transaminase levels: Secondary | ICD-10-CM | POA: Diagnosis not present

## 2024-07-28 DIAGNOSIS — N529 Male erectile dysfunction, unspecified: Secondary | ICD-10-CM

## 2024-07-28 DIAGNOSIS — I5189 Other ill-defined heart diseases: Secondary | ICD-10-CM | POA: Insufficient documentation

## 2024-07-28 DIAGNOSIS — Z131 Encounter for screening for diabetes mellitus: Secondary | ICD-10-CM

## 2024-07-28 DIAGNOSIS — Z1211 Encounter for screening for malignant neoplasm of colon: Secondary | ICD-10-CM | POA: Insufficient documentation

## 2024-07-28 DIAGNOSIS — Z0000 Encounter for general adult medical examination without abnormal findings: Secondary | ICD-10-CM | POA: Diagnosis not present

## 2024-07-28 DIAGNOSIS — E876 Hypokalemia: Secondary | ICD-10-CM | POA: Diagnosis not present

## 2024-07-28 LAB — COMPREHENSIVE METABOLIC PANEL WITH GFR
ALT: 15 U/L (ref 0–53)
AST: 15 U/L (ref 0–37)
Albumin: 4.3 g/dL (ref 3.5–5.2)
Alkaline Phosphatase: 63 U/L (ref 39–117)
BUN: 13 mg/dL (ref 6–23)
CO2: 31 meq/L (ref 19–32)
Calcium: 9.5 mg/dL (ref 8.4–10.5)
Chloride: 102 meq/L (ref 96–112)
Creatinine, Ser: 0.97 mg/dL (ref 0.40–1.50)
GFR: 83.41 mL/min (ref >60.00)
Glucose, Bld: 93 mg/dL (ref 70–99)
Potassium: 4.6 meq/L (ref 3.5–5.1)
Sodium: 140 meq/L (ref 135–145)
Total Bilirubin: 0.6 mg/dL (ref 0.2–1.2)
Total Protein: 6.7 g/dL (ref 6.0–8.3)

## 2024-07-28 LAB — HEMOGLOBIN A1C: Hgb A1c MFr Bld: 5.5 % (ref 4.6–6.5)

## 2024-07-28 LAB — LIPID PANEL
Cholesterol: 230 mg/dL — ABNORMAL HIGH (ref 0–200)
HDL: 66.4 mg/dL (ref >39.00)
LDL Cholesterol: 139 mg/dL — ABNORMAL HIGH (ref 0–99)
NonHDL: 163.36
Total CHOL/HDL Ratio: 3
Triglycerides: 120 mg/dL (ref 0.0–149.0)
VLDL: 24 mg/dL (ref 0.0–40.0)

## 2024-07-28 LAB — PSA: PSA: 1.33 ng/mL (ref 0.10–4.00)

## 2024-07-28 NOTE — Assessment & Plan Note (Signed)
 01/26/24, when he was diagnosed with Influenza with following labs CMP:  Hyponatremia: Na:133 Hypokalemia, Serum K: 3.2, serum Mg was normal at 2 AST elevated: 47  Likely related to dehydration. Will repeat CMP today.

## 2024-07-28 NOTE — Assessment & Plan Note (Signed)
 Evident from echocardiogram from 05/14/23, this was done for chest pain and further evaluation at the time was normal. Patient is euvolemic. BP is within goal. Optimize co-morbidities: hyperlipidemia, regular exercise, reduce alcohol intake.

## 2024-07-28 NOTE — Assessment & Plan Note (Addendum)
 Screening cologuard ordered, patient counseled on if cologuard positive he will need diagnostic colonoscopy.  Will screen for prostate cancer with PSA given family h/o prostate cancer.  Check A1c for diabetes/hyperglycemia screening.  Patient counseled on updating shingles, pneumonia, covid-19 vaccine. He is recommended to check with insurance to check on coverage and location it must be given. If cheaper to get at local pharmacy he can get that updated through them.

## 2024-07-28 NOTE — Progress Notes (Signed)
 Established Patient Office Visit TOC from Dr. Maribeth   Subjective  Patient ID: Christopher Gill, male    DOB: [birth year]  Age: 63 y.o. MRN: [id number]  Chief Complaint  Patient presents with   Establish Care    He  has a past medical history of Erectile dysfunction, Heart murmur, Hyperlipidemia, Numbness and tingling in left arm (04/09/2023), and Numbness of face (04/09/2023).  HPI Discussed the use of AI scribe software for clinical note transcription with the patient, who gave verbal consent to proceed.  History of Present Illness Christopher Gill is a 63 year old male who presents to establish care and for an annual physical exam.  Patient is due for colorectal cancer screening, HIV screening. He qualifies for shingles, COVID 19, pneumonia immunization. He consumes about 12  alcoholic drinks per week, often exceeding two drinks per sitting, primarily wine or bourbon.   Diastolic dysfunction: Grade I based on echo from 05/14/2023. No chest pain, palpitations, shortness of breath, lower leg edema. He does not exercise regularly.   In January 2025, he experienced an episode of influenza that required an ER visit due to abnormal electrolytes, specifically low potassium and sodium.   He experiences occasional tingling in his fingers upon waking, which resolves quickly. He also notes slight arm swelling after long walks, but denies any surgeries on that side.  He has had elevated liver enzyme/AST in the past. He also has a h/o elevated LDL cholesterol, and is not keen on pharmacological intervention.   He works as an Retail banker, starting work at [employer], and often gets only five to six hours of sleep per night, sometimes napping at work.  He has a family history of prostate cancer in his brothers but no history of colon cancer. He has had a colonoscopy in the past and prefers non-invasive screening methods for colorectal cancer.   ROS As per HPI    Objective:     BP  102/66 (BP Location: Right Arm, Patient Position: Sitting, Cuff Size: Normal)   Pulse (!) 58   Temp 98.3 F (36.8 C) (Oral)   Ht 6' (1.829 m)   Wt 184 lb 12.8 oz (83.8 kg)   SpO2 96%   BMI 25.06 kg/m      07/28/2024    8:08 AM 04/09/2023   11:03 AM 11/19/2021    8:49 AM  Depression screen PHQ 2/9  Decreased Interest 0 0 0  Down, Depressed, Hopeless 0 0 0  PHQ - 2 Score 0 0 0  Altered sleeping 0 0   Tired, decreased energy 0 1   Change in appetite 0 0   Feeling bad or failure about yourself  0 0   Trouble concentrating 0 0   Moving slowly or fidgety/restless 0 0   Suicidal thoughts 0 0   PHQ-9 Score 0 1   Difficult doing work/chores Not difficult at all Not difficult at all       07/28/2024    8:08 AM 04/09/2023   11:03 AM  GAD 7 : Generalized Anxiety Score  Nervous, Anxious, on Edge 0 0  Control/stop worrying 0 0  Worry too much - different things 1 0  Trouble relaxing 0 0  Restless 0 0  Easily annoyed or irritable 0 0  Afraid - awful might happen 0 0  Total GAD 7 Score 1 0  Anxiety Difficulty Not difficult at all Not difficult at all      07/28/2024    8:08  AM 04/09/2023   11:03 AM 11/19/2021    8:49 AM  Depression screen PHQ 2/9  Decreased Interest 0 0 0  Down, Depressed, Hopeless 0 0 0  PHQ - 2 Score 0 0 0  Altered sleeping 0 0   Tired, decreased energy 0 1   Change in appetite 0 0   Feeling bad or failure about yourself  0 0   Trouble concentrating 0 0   Moving slowly or fidgety/restless 0 0   Suicidal thoughts 0 0   PHQ-9 Score 0 1   Difficult doing work/chores Not difficult at all Not difficult at all       07/28/2024    8:08 AM 04/09/2023   11:03 AM  GAD 7 : Generalized Anxiety Score  Nervous, Anxious, on Edge 0 0  Control/stop worrying 0 0  Worry too much - different things 1 0  Trouble relaxing 0 0  Restless 0 0  Easily annoyed or irritable 0 0  Afraid - awful might happen 0 0  Total GAD 7 Score 1 0  Anxiety Difficulty Not difficult at all Not  difficult at all   SDOH Screenings   Food Insecurity: Patient Declined (07/28/2024)  Housing: Unknown (07/28/2024)  Transportation Needs: No Transportation Needs (07/28/2024)  Alcohol Screen: Low Risk  (07/28/2024)  Depression (PHQ2-9): Low Risk  (07/28/2024)  Financial Resource Strain: Patient Declined (07/28/2024)  Physical Activity: Inactive (07/28/2024)  Social Connections: Unknown (07/28/2024)  Stress: No Stress Concern Present (07/28/2024)  Tobacco Use: Low Risk  (07/28/2024)     Physical Exam Constitutional:      Appearance: Normal appearance. He is normal weight.  HENT:     Head: Normocephalic and atraumatic.     Right Ear: Tympanic membrane normal.     Left Ear: Tympanic membrane normal.     Mouth/Throat:     Mouth: Mucous membranes are moist.  Neck:     Thyroid : No thyroid  mass or thyroid  tenderness.  Cardiovascular:     Rate and Rhythm: Normal rate and regular rhythm.  Pulmonary:     Effort: Pulmonary effort is normal.     Breath sounds: Normal breath sounds.  Abdominal:     General: Bowel sounds are normal.     Palpations: Abdomen is soft.     Tenderness: There is no abdominal tenderness. There is no guarding.  Musculoskeletal:     Right forearm: No edema.     Left forearm: No edema.     Cervical back: Neck supple. No rigidity.     Right lower leg: No edema.     Left lower leg: No edema.  Lymphadenopathy:     Cervical: No cervical adenopathy.  Skin:    General: Skin is warm.  Neurological:     Mental Status: He is alert and oriented to person, place, and time.     Gait: Gait normal.  Psychiatric:        Mood and Affect: Mood normal.        Behavior: Behavior normal.        No results found for any visits on 07/28/24.  The 10-year ASCVD risk score (Arnett DK, et al., 2019) is: 5.6%     Assessment & Plan:  Patient is a pleasant 63 year old male presenting to establish care and annual physical exam.    Annual physical exam Assessment & Plan: Screening  cologuard ordered, patient counseled on if cologuard positive he will need diagnostic colonoscopy.  Will screen for prostate cancer with  PSA given family h/o prostate cancer.  Check A1c for diabetes/hyperglycemia screening.  Patient counseled on updating shingles, pneumonia, covid-19 vaccine. He is recommended to check with insurance to check on coverage and location it must be given. If cheaper to get at local pharmacy he can get that updated through them.    Hyperlipidemia, unspecified hyperlipidemia type Assessment & Plan: 04/09/23 LDL with 121, repeat fasting lipid panel today.  If LDL >190 or 10 years risk of ASCVD >10% will recommend starting pharmacological intervention. Extensive counseling provided on limiting alcohol consumption, start moderate intensity exercise 30 min/day for 5 days a week or 150 min total per week, incorporate mediterranean like diet.   Orders: -     Lipid panel  Hyponatremia Assessment & Plan: 01/26/24, when he was diagnosed with Influenza with following labs CMP:  Hyponatremia: Na:133 Hypokalemia, Serum K: 3.2, serum Mg was normal at 2 AST elevated: 47  Likely related to dehydration. Will repeat CMP today.   Orders: -     Comprehensive metabolic panel with GFR  Hypokalemia Assessment & Plan: 01/26/24, when he was diagnosed with Influenza with following labs CMP:  Hyponatremia: Na:133 Hypokalemia, Serum K: 3.2, serum Mg was normal at 2 AST elevated: 47  Likely related to dehydration. Will repeat CMP today.   Orders: -     Comprehensive metabolic panel with GFR  Elevated AST (SGOT) Assessment & Plan: Evident on 12/2023. Patient asymptomatic. Will repeat CMP. Counseled patient on reducing or discontinuing alcohol consumption to improve liver function, over all health.    Diastolic dysfunction Assessment & Plan: Evident from echocardiogram from 05/14/23, this was done for chest pain and further evaluation at the time was normal. Patient is  euvolemic. BP is within goal. Optimize co-morbidities: hyperlipidemia, regular exercise, reduce alcohol intake.    Encounter for screening examination for intermediate hyperglycemia and diabetes mellitus Assessment & Plan: Screening cologuard ordered, patient counseled on if cologuard positive he will need diagnostic colonoscopy.  Will screen for prostate cancer with PSA given family h/o prostate cancer.  Check A1c for diabetes/hyperglycemia screening.  Patient counseled on updating shingles, pneumonia, covid-19 vaccine. He is recommended to check with insurance to check on coverage and location it must be given. If cheaper to get at local pharmacy he can get that updated through them.   Orders: -     Hemoglobin A1c  Screening for prostate cancer Assessment & Plan: Screening cologuard ordered, patient counseled on if cologuard positive he will need diagnostic colonoscopy.  Will screen for prostate cancer with PSA given family h/o prostate cancer.  Check A1c for diabetes/hyperglycemia screening.  Patient counseled on updating shingles, pneumonia, covid-19 vaccine. He is recommended to check with insurance to check on coverage and location it must be given. If cheaper to get at local pharmacy he can get that updated through them.   Orders: -     PSA  Screening for colon cancer Assessment & Plan: Screening cologuard ordered, patient counseled on if cologuard positive he will need diagnostic colonoscopy.  Will screen for prostate cancer with PSA given family h/o prostate cancer.  Check A1c for diabetes/hyperglycemia screening.  Patient counseled on updating shingles, pneumonia, covid-19 vaccine. He is recommended to check with insurance to check on coverage and location it must be given. If cheaper to get at local pharmacy he can get that updated through them.   Orders: -     Cologuard  Erectile dysfunction, unspecified erectile dysfunction type Assessment & Plan: F/U with urology at  [place], on prn Sildenafil. Continue f/u with urology.      Return in about 1 year (around 07/28/2025) for Annual physical .   Linah Klapper, MD

## 2024-07-28 NOTE — Assessment & Plan Note (Signed)
 Evident on 12/2023. Patient asymptomatic. Will repeat CMP. Counseled patient on reducing or discontinuing alcohol consumption to improve liver function, over all health.

## 2024-07-28 NOTE — Assessment & Plan Note (Addendum)
 04/09/23 LDL with 121, repeat fasting lipid panel today.  If LDL >190 or 10 years risk of ASCVD >10% will recommend starting pharmacological intervention. Extensive counseling provided on limiting alcohol consumption, start moderate intensity exercise 30 min/day for 5 days a week or 150 min total per week, incorporate mediterranean like diet.

## 2024-07-28 NOTE — Patient Instructions (Addendum)
 If you are interested in the shingles vaccine series (Shingrix), call your insurance or pharmacy to check on coverage and location it must be given.  If affordable - you can schedule it here or at your pharmacy depending on coverage.    If your labs are abnormal I recommend we see you in 6 months for repeat lab work otherwise once a year follow up is okay.

## 2024-07-28 NOTE — Assessment & Plan Note (Signed)
 F/U with urology at [facility], on prn Sildenafil. Continue f/u with urology.

## 2024-07-31 ENCOUNTER — Ambulatory Visit: Payer: Self-pay

## 2024-08-15 LAB — COLOGUARD: COLOGUARD: NEGATIVE

## 2024-11-14 NOTE — Telephone Encounter (Signed)
 Open in error

## 2025-01-31 ENCOUNTER — Encounter

## 2025-08-03 ENCOUNTER — Encounter

## 2025-08-17 ENCOUNTER — Encounter
